# Patient Record
Sex: Male | Born: 1935
Health system: Southern US, Community
[De-identification: ages and names within clinical notes are randomized; demographics above are authoritative.]

## PROBLEM LIST (undated history)

## (undated) DIAGNOSIS — K222 Esophageal obstruction: Secondary | ICD-10-CM

## (undated) DIAGNOSIS — K5790 Diverticulosis of intestine, part unspecified, without perforation or abscess without bleeding: Secondary | ICD-10-CM

## (undated) DIAGNOSIS — I252 Old myocardial infarction: Secondary | ICD-10-CM

## (undated) DIAGNOSIS — I10 Essential (primary) hypertension: Secondary | ICD-10-CM

## (undated) DIAGNOSIS — K219 Gastro-esophageal reflux disease without esophagitis: Secondary | ICD-10-CM

## (undated) DIAGNOSIS — R1310 Dysphagia, unspecified: Secondary | ICD-10-CM

## (undated) DIAGNOSIS — D509 Iron deficiency anemia, unspecified: Secondary | ICD-10-CM

## (undated) DIAGNOSIS — Z862 Personal history of diseases of the blood and blood-forming organs and certain disorders involving the immune mechanism: Secondary | ICD-10-CM

## (undated) DIAGNOSIS — Z87891 Personal history of nicotine dependence: Secondary | ICD-10-CM

## (undated) DIAGNOSIS — Z860100 Personal history of colon polyps, unspecified: Secondary | ICD-10-CM

## (undated) DIAGNOSIS — I00 Rheumatic fever without heart involvement: Secondary | ICD-10-CM

## (undated) HISTORY — DX: Gastro-esophageal reflux disease without esophagitis: K21.9

## (undated) HISTORY — PX: SKIN CANCER EXCISION: SHX779

## (undated) HISTORY — DX: Esophageal obstruction: K22.2

## (undated) HISTORY — DX: Essential (primary) hypertension: I10

## (undated) HISTORY — DX: Dysphagia, unspecified: R13.10

## (undated) HISTORY — DX: Rheumatic fever without heart involvement: I00

## (undated) HISTORY — DX: Diverticulosis of intestine, part unspecified, without perforation or abscess without bleeding: K57.90

## (undated) HISTORY — PX: ADENOIDECTOMY: SUR15

## (undated) HISTORY — DX: Personal history of diseases of the blood and blood-forming organs and certain disorders involving the immune mechanism: Z86.2

## (undated) HISTORY — DX: Personal history of nicotine dependence: Z87.891

## (undated) HISTORY — PX: STENT PLACEMENT VASCULAR (ARMC HX): HXRAD1737

## (undated) HISTORY — DX: Iron deficiency anemia, unspecified: D50.9

## (undated) HISTORY — PX: BREAST SURGERY: SHX581

## (undated) HISTORY — DX: Personal history of colon polyps, unspecified: Z86.0100

## (undated) HISTORY — PX: TONSILLECTOMY: SUR1361

## (undated) HISTORY — DX: Old myocardial infarction: I25.2

## (undated) HISTORY — PX: HERNIA REPAIR: SHX51

---

## 2014-08-28 DIAGNOSIS — Z79899 Other long term (current) drug therapy: Secondary | ICD-10-CM | POA: Diagnosis not present

## 2014-08-28 DIAGNOSIS — D509 Iron deficiency anemia, unspecified: Secondary | ICD-10-CM | POA: Diagnosis not present

## 2014-08-28 DIAGNOSIS — Z23 Encounter for immunization: Secondary | ICD-10-CM | POA: Diagnosis not present

## 2014-08-28 DIAGNOSIS — Z0000 Encounter for general adult medical examination without abnormal findings: Secondary | ICD-10-CM | POA: Diagnosis not present

## 2014-08-28 DIAGNOSIS — Z125 Encounter for screening for malignant neoplasm of prostate: Secondary | ICD-10-CM | POA: Diagnosis not present

## 2014-08-28 DIAGNOSIS — E782 Mixed hyperlipidemia: Secondary | ICD-10-CM | POA: Diagnosis not present

## 2014-08-29 DIAGNOSIS — D509 Iron deficiency anemia, unspecified: Secondary | ICD-10-CM | POA: Diagnosis not present

## 2014-09-02 DIAGNOSIS — Z1211 Encounter for screening for malignant neoplasm of colon: Secondary | ICD-10-CM | POA: Diagnosis not present

## 2014-09-02 DIAGNOSIS — D649 Anemia, unspecified: Secondary | ICD-10-CM | POA: Diagnosis not present

## 2014-09-02 DIAGNOSIS — R195 Other fecal abnormalities: Secondary | ICD-10-CM | POA: Diagnosis not present

## 2014-09-05 DIAGNOSIS — D125 Benign neoplasm of sigmoid colon: Secondary | ICD-10-CM | POA: Diagnosis not present

## 2014-09-05 DIAGNOSIS — B3781 Candidal esophagitis: Secondary | ICD-10-CM | POA: Diagnosis not present

## 2014-09-05 DIAGNOSIS — K5730 Diverticulosis of large intestine without perforation or abscess without bleeding: Secondary | ICD-10-CM | POA: Diagnosis not present

## 2014-09-05 DIAGNOSIS — D500 Iron deficiency anemia secondary to blood loss (chronic): Secondary | ICD-10-CM | POA: Diagnosis not present

## 2014-09-05 DIAGNOSIS — R195 Other fecal abnormalities: Secondary | ICD-10-CM | POA: Diagnosis not present

## 2014-09-05 DIAGNOSIS — D123 Benign neoplasm of transverse colon: Secondary | ICD-10-CM | POA: Diagnosis not present

## 2014-09-17 DIAGNOSIS — D509 Iron deficiency anemia, unspecified: Secondary | ICD-10-CM | POA: Diagnosis not present

## 2014-09-17 DIAGNOSIS — R195 Other fecal abnormalities: Secondary | ICD-10-CM | POA: Diagnosis not present

## 2014-10-03 DIAGNOSIS — D509 Iron deficiency anemia, unspecified: Secondary | ICD-10-CM | POA: Diagnosis not present

## 2014-10-03 DIAGNOSIS — R195 Other fecal abnormalities: Secondary | ICD-10-CM | POA: Diagnosis not present

## 2014-10-07 DIAGNOSIS — R195 Other fecal abnormalities: Secondary | ICD-10-CM | POA: Diagnosis not present

## 2014-10-07 DIAGNOSIS — D509 Iron deficiency anemia, unspecified: Secondary | ICD-10-CM | POA: Diagnosis not present

## 2014-10-07 DIAGNOSIS — B3781 Candidal esophagitis: Secondary | ICD-10-CM | POA: Diagnosis not present

## 2014-12-05 DIAGNOSIS — D649 Anemia, unspecified: Secondary | ICD-10-CM | POA: Diagnosis not present

## 2015-01-22 DIAGNOSIS — D509 Iron deficiency anemia, unspecified: Secondary | ICD-10-CM | POA: Diagnosis not present

## 2015-01-22 DIAGNOSIS — Z23 Encounter for immunization: Secondary | ICD-10-CM | POA: Diagnosis not present

## 2015-01-23 DIAGNOSIS — Z1212 Encounter for screening for malignant neoplasm of rectum: Secondary | ICD-10-CM | POA: Diagnosis not present

## 2015-02-10 DIAGNOSIS — R1310 Dysphagia, unspecified: Secondary | ICD-10-CM | POA: Diagnosis not present

## 2015-02-10 DIAGNOSIS — R195 Other fecal abnormalities: Secondary | ICD-10-CM | POA: Diagnosis not present

## 2015-02-11 DIAGNOSIS — B3781 Candidal esophagitis: Secondary | ICD-10-CM | POA: Diagnosis not present

## 2015-02-11 DIAGNOSIS — R1310 Dysphagia, unspecified: Secondary | ICD-10-CM | POA: Diagnosis not present

## 2015-02-11 DIAGNOSIS — K219 Gastro-esophageal reflux disease without esophagitis: Secondary | ICD-10-CM | POA: Diagnosis not present

## 2015-02-16 DIAGNOSIS — Z79899 Other long term (current) drug therapy: Secondary | ICD-10-CM | POA: Diagnosis not present

## 2015-02-16 DIAGNOSIS — Z1389 Encounter for screening for other disorder: Secondary | ICD-10-CM | POA: Diagnosis not present

## 2015-02-16 DIAGNOSIS — R1310 Dysphagia, unspecified: Secondary | ICD-10-CM | POA: Diagnosis not present

## 2015-02-16 DIAGNOSIS — B3781 Candidal esophagitis: Secondary | ICD-10-CM | POA: Diagnosis not present

## 2015-02-16 DIAGNOSIS — E782 Mixed hyperlipidemia: Secondary | ICD-10-CM | POA: Diagnosis not present

## 2015-02-16 DIAGNOSIS — Z9181 History of falling: Secondary | ICD-10-CM | POA: Diagnosis not present

## 2015-02-19 DIAGNOSIS — B3781 Candidal esophagitis: Secondary | ICD-10-CM | POA: Diagnosis not present

## 2015-07-20 DIAGNOSIS — L820 Inflamed seborrheic keratosis: Secondary | ICD-10-CM | POA: Diagnosis not present

## 2015-07-20 DIAGNOSIS — D509 Iron deficiency anemia, unspecified: Secondary | ICD-10-CM | POA: Diagnosis not present

## 2015-07-20 DIAGNOSIS — B356 Tinea cruris: Secondary | ICD-10-CM | POA: Diagnosis not present

## 2015-09-23 DIAGNOSIS — J329 Chronic sinusitis, unspecified: Secondary | ICD-10-CM | POA: Diagnosis not present

## 2015-09-23 DIAGNOSIS — J40 Bronchitis, not specified as acute or chronic: Secondary | ICD-10-CM | POA: Diagnosis not present

## 2015-09-23 DIAGNOSIS — Z0000 Encounter for general adult medical examination without abnormal findings: Secondary | ICD-10-CM | POA: Diagnosis not present

## 2015-11-17 DIAGNOSIS — L089 Local infection of the skin and subcutaneous tissue, unspecified: Secondary | ICD-10-CM | POA: Diagnosis not present

## 2015-11-17 DIAGNOSIS — L723 Sebaceous cyst: Secondary | ICD-10-CM | POA: Diagnosis not present

## 2015-11-18 DIAGNOSIS — I10 Essential (primary) hypertension: Secondary | ICD-10-CM | POA: Diagnosis not present

## 2015-11-18 DIAGNOSIS — S51851A Open bite of right forearm, initial encounter: Secondary | ICD-10-CM | POA: Diagnosis not present

## 2016-02-29 DIAGNOSIS — Z23 Encounter for immunization: Secondary | ICD-10-CM | POA: Diagnosis not present

## 2016-04-28 DIAGNOSIS — M7581 Other shoulder lesions, right shoulder: Secondary | ICD-10-CM | POA: Diagnosis not present

## 2016-05-30 DIAGNOSIS — L301 Dyshidrosis [pompholyx]: Secondary | ICD-10-CM | POA: Diagnosis not present

## 2016-05-30 DIAGNOSIS — E782 Mixed hyperlipidemia: Secondary | ICD-10-CM | POA: Diagnosis not present

## 2016-05-30 DIAGNOSIS — Z79899 Other long term (current) drug therapy: Secondary | ICD-10-CM | POA: Diagnosis not present

## 2016-06-02 DIAGNOSIS — Z1389 Encounter for screening for other disorder: Secondary | ICD-10-CM | POA: Diagnosis not present

## 2016-06-02 DIAGNOSIS — I10 Essential (primary) hypertension: Secondary | ICD-10-CM | POA: Diagnosis not present

## 2016-06-02 DIAGNOSIS — E782 Mixed hyperlipidemia: Secondary | ICD-10-CM | POA: Diagnosis not present

## 2016-06-02 DIAGNOSIS — R1313 Dysphagia, pharyngeal phase: Secondary | ICD-10-CM | POA: Diagnosis not present

## 2016-06-02 DIAGNOSIS — K219 Gastro-esophageal reflux disease without esophagitis: Secondary | ICD-10-CM | POA: Diagnosis not present

## 2016-09-19 DIAGNOSIS — D649 Anemia, unspecified: Secondary | ICD-10-CM | POA: Diagnosis not present

## 2016-10-27 DIAGNOSIS — N6310 Unspecified lump in the right breast, unspecified quadrant: Secondary | ICD-10-CM | POA: Diagnosis not present

## 2016-10-27 DIAGNOSIS — D509 Iron deficiency anemia, unspecified: Secondary | ICD-10-CM | POA: Diagnosis not present

## 2016-11-02 DIAGNOSIS — R928 Other abnormal and inconclusive findings on diagnostic imaging of breast: Secondary | ICD-10-CM | POA: Diagnosis not present

## 2016-11-02 DIAGNOSIS — N6489 Other specified disorders of breast: Secondary | ICD-10-CM | POA: Diagnosis not present

## 2016-11-02 DIAGNOSIS — N644 Mastodynia: Secondary | ICD-10-CM | POA: Diagnosis not present

## 2016-11-02 DIAGNOSIS — N62 Hypertrophy of breast: Secondary | ICD-10-CM | POA: Diagnosis not present

## 2016-11-03 DIAGNOSIS — M7581 Other shoulder lesions, right shoulder: Secondary | ICD-10-CM | POA: Diagnosis not present

## 2017-01-03 DIAGNOSIS — D509 Iron deficiency anemia, unspecified: Secondary | ICD-10-CM | POA: Diagnosis not present

## 2017-01-16 DIAGNOSIS — R05 Cough: Secondary | ICD-10-CM | POA: Diagnosis not present

## 2017-01-16 DIAGNOSIS — D492 Neoplasm of unspecified behavior of bone, soft tissue, and skin: Secondary | ICD-10-CM | POA: Diagnosis not present

## 2017-01-30 DIAGNOSIS — Z23 Encounter for immunization: Secondary | ICD-10-CM | POA: Diagnosis not present

## 2017-01-31 DIAGNOSIS — C44219 Basal cell carcinoma of skin of left ear and external auricular canal: Secondary | ICD-10-CM | POA: Diagnosis not present

## 2017-01-31 DIAGNOSIS — L821 Other seborrheic keratosis: Secondary | ICD-10-CM | POA: Diagnosis not present

## 2017-02-15 DIAGNOSIS — C44219 Basal cell carcinoma of skin of left ear and external auricular canal: Secondary | ICD-10-CM | POA: Diagnosis not present

## 2017-05-04 DIAGNOSIS — Z79899 Other long term (current) drug therapy: Secondary | ICD-10-CM | POA: Diagnosis not present

## 2017-05-04 DIAGNOSIS — D509 Iron deficiency anemia, unspecified: Secondary | ICD-10-CM | POA: Diagnosis not present

## 2017-05-04 DIAGNOSIS — D649 Anemia, unspecified: Secondary | ICD-10-CM | POA: Diagnosis not present

## 2017-05-04 DIAGNOSIS — Z0000 Encounter for general adult medical examination without abnormal findings: Secondary | ICD-10-CM | POA: Diagnosis not present

## 2017-05-04 DIAGNOSIS — M76892 Other specified enthesopathies of left lower limb, excluding foot: Secondary | ICD-10-CM | POA: Diagnosis not present

## 2017-05-04 DIAGNOSIS — K219 Gastro-esophageal reflux disease without esophagitis: Secondary | ICD-10-CM | POA: Diagnosis not present

## 2017-05-04 DIAGNOSIS — I10 Essential (primary) hypertension: Secondary | ICD-10-CM | POA: Diagnosis not present

## 2017-08-02 DIAGNOSIS — D509 Iron deficiency anemia, unspecified: Secondary | ICD-10-CM | POA: Diagnosis not present

## 2017-08-21 DIAGNOSIS — K2970 Gastritis, unspecified, without bleeding: Secondary | ICD-10-CM | POA: Diagnosis not present

## 2017-08-29 DIAGNOSIS — K449 Diaphragmatic hernia without obstruction or gangrene: Secondary | ICD-10-CM | POA: Diagnosis not present

## 2017-08-29 DIAGNOSIS — K648 Other hemorrhoids: Secondary | ICD-10-CM | POA: Diagnosis not present

## 2017-08-29 DIAGNOSIS — D649 Anemia, unspecified: Secondary | ICD-10-CM | POA: Diagnosis not present

## 2017-08-29 DIAGNOSIS — B3781 Candidal esophagitis: Secondary | ICD-10-CM | POA: Diagnosis not present

## 2017-08-29 DIAGNOSIS — D126 Benign neoplasm of colon, unspecified: Secondary | ICD-10-CM | POA: Diagnosis not present

## 2017-08-29 DIAGNOSIS — K222 Esophageal obstruction: Secondary | ICD-10-CM | POA: Diagnosis not present

## 2017-08-29 DIAGNOSIS — K219 Gastro-esophageal reflux disease without esophagitis: Secondary | ICD-10-CM | POA: Diagnosis not present

## 2017-08-29 DIAGNOSIS — Z79899 Other long term (current) drug therapy: Secondary | ICD-10-CM | POA: Diagnosis not present

## 2017-08-29 DIAGNOSIS — I10 Essential (primary) hypertension: Secondary | ICD-10-CM | POA: Diagnosis not present

## 2017-08-29 DIAGNOSIS — R1310 Dysphagia, unspecified: Secondary | ICD-10-CM | POA: Diagnosis not present

## 2017-08-29 DIAGNOSIS — D122 Benign neoplasm of ascending colon: Secondary | ICD-10-CM | POA: Diagnosis not present

## 2017-08-29 DIAGNOSIS — K5730 Diverticulosis of large intestine without perforation or abscess without bleeding: Secondary | ICD-10-CM | POA: Diagnosis not present

## 2017-08-29 DIAGNOSIS — D509 Iron deficiency anemia, unspecified: Secondary | ICD-10-CM | POA: Diagnosis not present

## 2017-10-25 DIAGNOSIS — D649 Anemia, unspecified: Secondary | ICD-10-CM | POA: Diagnosis not present

## 2017-11-01 DIAGNOSIS — K2970 Gastritis, unspecified, without bleeding: Secondary | ICD-10-CM | POA: Diagnosis not present

## 2018-01-04 DIAGNOSIS — R05 Cough: Secondary | ICD-10-CM | POA: Diagnosis not present

## 2018-01-04 DIAGNOSIS — Z9181 History of falling: Secondary | ICD-10-CM | POA: Diagnosis not present

## 2018-01-30 DIAGNOSIS — R05 Cough: Secondary | ICD-10-CM | POA: Diagnosis not present

## 2018-01-30 DIAGNOSIS — Z23 Encounter for immunization: Secondary | ICD-10-CM | POA: Diagnosis not present

## 2018-02-21 ENCOUNTER — Encounter: Payer: Self-pay | Admitting: Allergy and Immunology

## 2018-02-21 ENCOUNTER — Telehealth: Payer: Self-pay

## 2018-02-21 ENCOUNTER — Ambulatory Visit: Payer: Medicare Other | Admitting: Allergy and Immunology

## 2018-02-21 VITALS — BP 136/78 | HR 60 | Temp 97.7°F | Resp 18 | Ht 70.0 in | Wt 236.0 lb

## 2018-02-21 DIAGNOSIS — J3089 Other allergic rhinitis: Secondary | ICD-10-CM | POA: Diagnosis not present

## 2018-02-21 DIAGNOSIS — R053 Chronic cough: Secondary | ICD-10-CM

## 2018-02-21 DIAGNOSIS — R05 Cough: Secondary | ICD-10-CM | POA: Diagnosis not present

## 2018-02-21 DIAGNOSIS — K219 Gastro-esophageal reflux disease without esophagitis: Secondary | ICD-10-CM | POA: Diagnosis not present

## 2018-02-21 MED ORDER — MONTELUKAST SODIUM 10 MG PO TABS: 10.0000 mg | ORAL_TABLET | Freq: Every day | ORAL | 1 refills | Status: DC

## 2018-02-21 MED ORDER — FAMOTIDINE 40 MG PO TABS: 40.0000 mg | ORAL_TABLET | Freq: Every day | ORAL | 1 refills | Status: DC

## 2018-02-21 MED ORDER — OMEPRAZOLE 40 MG PO CPDR: DELAYED_RELEASE_CAPSULE | ORAL | 1 refills | Status: DC

## 2018-02-21 NOTE — Patient Instructions (Addendum)
  1.  Obtain a chest x-ray at [facility] and rhinoscopy to look at throat with ENT  2.  Eliminate use of fish oil  3.  Treat reflux:   A.  Increase omeprazole 40 mg twice a day  B.  Start famotidine 40 mg in evening  C.  Slowly consolidate all forms of caffeine  4.  Treat inflammation:   A.  OTC Nasacort -1 spray each nostril 1 time per day  B.  Montelukast 10 mg -1 tablet 1 time per day  5.  If needed:   A.  OTC Mucinex DM 2 tablets twice a day  B.  Xyzal 5 mg 1 tablet once a day  6.  Review results from 06/07/2017 upper endoscopies  7.  Return to clinic in 4 weeks or earlier if problem

## 2018-02-21 NOTE — Progress Notes (Signed)
Dear Dr. Humphrey Rolls,  Thank you for referring Ian Diaz to the [facility] on 02/21/2018.   Below is a summation of this patient's evaluation and recommendations.  Thank you for your referral. I will keep you informed about this patient's response to treatment.   If you have any questions please do not hesitate to contact me.   Sincerely,  Jiles Prows, MD Allergy / Immunology [facility]   ______________________________________________________________________    NEW PATIENT NOTE  Referring Provider: Mateo Flow, MD Primary Provider: Mateo Flow, MD Date of office visit: 02/21/2018    Subjective:   Chief Complaint:  Ian Diaz (DOB: Feb 05, 1936) is a 82 y.o. male who presents to the clinic on 02/21/2018 with a chief complaint of Cough .     HPI: Hoyt presents to this clinic in evaluation of cough.  He has had a cough for greater than a decade but over the course of the past 2 years this cough has become quite progressive.  He coughs and brings up phlegm.  He is not sure if the phlegm is coming from his chest or his throat but he definitely has a sensation of a coating in his throat that is difficult to clear and he has lots of throat clearing.  He coughs so hard that he develops inguinal and abdominal pain from straining but he does not have a tremendous amount of gagging or retching or posttussive emesis.  Cough does appear to occur at nighttime as well as daytime.  He wakes up in the morning and has a little bit of nasal congestion and nose blowing.  He does not have any anosmia or ugly nasal discharge or history of recurrent headaches.  He has also had painful swallowing with both hot liquids and cool liquids over the course of the past few months but this has dramatically decreased over the course of the past several weeks.  He has never had a history of asthma and he does  not develop shortness of breath or chest tightness and has never heard himself wheeze.  He did smoke tobacco at 2 to 3 packs a day for about 40 years which he discontinued in 1990.  He has very bad reflux with regurgitation and burning all the way up into his throat for which he would use a pack of Tums every day.  For the past 2 years he has been using omeprazole which he thinks helps this issue dramatically.  He drinks at least 4 coffees per day and may be up to 8 coffees per day and he drinks alcohol 1 or 2 times per week.  He apparently had 2 upper endoscopies performed in 2019, 1 by Dr. Melina Copa and one by Dr. Lyda Jester, for what sounds like a very significant iron deficiency anemia.  Apparently there was a "fungus" found in his esophagus.  Therapy to date has included the use of Advair which he used for 1 month which did not help him at all.  He has used multiple "allergy pills" which has not helped him at all.  There has not really been an obvious provoking factor giving rise to this cough.  Past Medical History:  Diagnosis Date  . Acid reflux   . History of tobacco abuse   . Hypertension   . Iron deficiency anemia   . Rheumatic fever childhood    Past Surgical History:  Procedure Laterality  Date  . ADENOIDECTOMY    . BREAST SURGERY     Tumor  . HERNIA REPAIR    . SKIN CANCER EXCISION    . TONSILLECTOMY      Allergies as of 02/21/2018   No Known Allergies     Medication List      amLODipine 5 MG tablet Commonly known as:  NORVASC Take 5 mg by mouth daily.   atenolol 25 MG tablet Commonly known as:  TENORMIN Take 25 mg by mouth daily.   candesartan-hydrochlorothiazide 16-12.5 MG tablet Commonly known as:  ATACAND HCT Take 1 tablet by mouth daily.   CENTRUM SILVER PO Take by mouth daily.   FISH OIL PO Take by mouth daily.   IRON (FERROUS SULFATE) PO Take by mouth daily.   levocetirizine 5 MG tablet Commonly known as:  XYZAL Take 5 mg by mouth daily.     omeprazole 20 MG capsule Commonly known as:  PRILOSEC Take 20 mg by mouth daily.   VITAMIN C PO Take by mouth daily.       Review of systems negative except as noted in HPI / PMHx or noted below:  Review of Systems  Constitutional: Negative.   HENT: Negative.   Eyes: Negative.   Respiratory: Negative.   Cardiovascular: Negative.   Gastrointestinal: Negative.   Genitourinary: Negative.   Musculoskeletal: Negative.   Skin: Negative.   Neurological: Negative.   Endo/Heme/Allergies: Negative.   Psychiatric/Behavioral: Negative.     Family History  Problem Relation Age of Onset  . High blood pressure Mother   . Heart attack Mother   . Stroke Father   . High blood pressure Father   . COPD Brother     Social History   Socioeconomic History  . Marital status: Married    Spouse name: Not on file  . Number of children: Not on file  . Years of education: Not on file  . Highest education level: Not on file  Occupational History  . Not on file  Social Needs  . Financial resource strain: Not on file  . Food insecurity:    Worry: Not on file    Inability: Not on file  . Transportation needs:    Medical: Not on file    Non-medical: Not on file  Tobacco Use  . Smoking status: Former Smoker    Packs/day: 3.00    Years: 40.00    Pack years: 120.00    Types: Cigarettes    Last attempt to quit: 1990    Years since quitting: 29.8  . Smokeless tobacco: Never Used  Substance and Sexual Activity  . Alcohol use: Yes    Comment: Social   . Drug use: Never  . Sexual activity: Not on file  Lifestyle  . Physical activity:    Days per week: Not on file    Minutes per session: Not on file  . Stress: Not on file  Relationships  . Social connections:    Talks on phone: Not on file    Gets together: Not on file    Attends religious service: Not on file    Active member of club or organization: Not on file    Attends meetings of clubs or organizations: Not on file     Relationship status: Not on file  . Intimate partner violence:    Fear of current or ex partner: Not on file    Emotionally abused: Not on file    Physically abused: Not on file  Forced sexual activity: Not on file  Other Topics Concern  . Not on file  Social History Narrative  . Not on file    Environmental and Social history  Lives in a house with a dry environment, a cat located inside the household, carpet in the bedroom, no plastic on the bed, no plastic on the pillow, no smokers located inside the household.  Objective:   Vitals:   02/21/18 1358  BP: 136/78  Pulse: 60  Resp: 18  Temp: 97.7 F (36.5 C)   Height: 5\' 10"  (177.8 cm) Weight: 236 lb (107 kg)  Physical Exam  Constitutional:  Slight cough, throat clearing  HENT:  Head: Normocephalic. Head is without right periorbital erythema and without left periorbital erythema.  Right Ear: Tympanic membrane, external ear and ear canal normal.  Left Ear: Tympanic membrane, external ear and ear canal normal.  Nose: Nose normal. No mucosal edema or rhinorrhea.  Mouth/Throat: Oropharynx is clear and moist and mucous membranes are normal. No oropharyngeal exudate.  Eyes: Pupils are equal, round, and reactive to light. Conjunctivae and lids are normal.  Neck: Trachea normal. No tracheal deviation present. No thyromegaly present.  Cardiovascular: Normal rate, regular rhythm, S1 normal, S2 normal and normal heart sounds.  No murmur heard. Pulmonary/Chest: Effort normal. No stridor. No respiratory distress. He has no wheezes. He has no rales. He exhibits no tenderness.  Abdominal: Soft. He exhibits no distension and no mass. There is no hepatosplenomegaly. There is no tenderness. There is no rebound and no guarding.  Musculoskeletal: He exhibits no edema or tenderness.  Lymphadenopathy:       Head (right side): No tonsillar adenopathy present.       Head (left side): No tonsillar adenopathy present.    He has no cervical  adenopathy.    He has no axillary adenopathy.  Neurological: He is alert.  Skin: No rash noted. He is not diaphoretic. No erythema. No pallor. Nails show no clubbing.    Diagnostics: Allergy skin tests were not performed secondary to the recent use of Xyzal.   Spirometry was performed and demonstrated an FEV1 of 2.66 @ 92 % of predicted. FEV1/FVC = 0.74.  Following administration of nebulized albuterol his FEV1 did not improve.  Assessment and Plan:    1. LPRD (laryngopharyngeal reflux disease)   2. Perennial allergic rhinitis   3. Persistent cough for 3 weeks or longer     1.  Obtain a chest x-ray at [facility] and rhinoscopy to look at throat with ENT  2.  Eliminate use of fish oil  3.  Treat reflux:   A.  Increase omeprazole 40 mg twice a day  B.  Start famotidine 40 mg in evening  C.  Slowly consolidate all forms of caffeine  4.  Treat inflammation:   A.  OTC Nasacort -1 spray each nostril 1 time per day  B.  Montelukast 10 mg -1 tablet 1 time per day  5.  If needed:   A.  OTC Mucinex DM 2 tablets twice a day  B.  Xyzal 5 mg 1 tablet once a day  6.  Review results from 06/07/2017 upper endoscopies  7.  Return to clinic in 4 weeks or earlier if problem  Wilkie probably has a multifactorial form of cough with contribution from airway inflammation and reflux.  I suspect that the major insult is reflux and we will aggressively treat this issue with therapy noted above and also have him use anti-inflammatory agents for  his airway.  I am going to have him obtain a chest x-ray and we will look at his throat via rhinoscopy with ENT evaluation.  I will regroup with him in 4 weeks to make a decision about further evaluation and treatment based upon his initial response to this plan.  Jiles Prows, MD Allergy / Immunology [facility]

## 2018-02-21 NOTE — Telephone Encounter (Signed)
Called and informed patient that he is scheduled at [facility] on Thursday, December 5th at 8:30am.  I did ask him to arrive a bit early to check-in.

## 2018-02-22 ENCOUNTER — Encounter: Payer: Self-pay | Admitting: Allergy and Immunology

## 2018-02-22 NOTE — Addendum Note (Signed)
Addended by: Carin Hock on: 02/22/2018 02:05 PM   Modules accepted: Orders

## 2018-02-23 ENCOUNTER — Telehealth: Payer: Self-pay | Admitting: *Deleted

## 2018-02-23 NOTE — Telephone Encounter (Signed)
Patient informed of chest CT appt at [facility] - November 13th arriving at 7:30 for an 8:30 appt. Order faxed.

## 2018-02-26 ENCOUNTER — Encounter: Payer: Self-pay | Admitting: *Deleted

## 2018-02-28 DIAGNOSIS — I700 Atherosclerosis of aorta: Secondary | ICD-10-CM | POA: Diagnosis not present

## 2018-02-28 DIAGNOSIS — R918 Other nonspecific abnormal finding of lung field: Secondary | ICD-10-CM | POA: Diagnosis not present

## 2018-03-19 ENCOUNTER — Encounter: Payer: Self-pay | Admitting: Allergy and Immunology

## 2018-03-19 ENCOUNTER — Ambulatory Visit: Payer: Medicare Other | Admitting: Allergy and Immunology

## 2018-03-19 VITALS — BP 136/72 | HR 60 | Resp 16

## 2018-03-19 DIAGNOSIS — R05 Cough: Secondary | ICD-10-CM

## 2018-03-19 DIAGNOSIS — K219 Gastro-esophageal reflux disease without esophagitis: Secondary | ICD-10-CM | POA: Diagnosis not present

## 2018-03-19 DIAGNOSIS — J3089 Other allergic rhinitis: Secondary | ICD-10-CM

## 2018-03-19 DIAGNOSIS — R053 Chronic cough: Secondary | ICD-10-CM

## 2018-03-19 NOTE — Patient Instructions (Addendum)
  1.  Keep ENT appointment this week  2.  Continue to treat reflux:   A.  omeprazole 40 mg twice a day  B.  famotidine 40 mg in evening  4.  Continue to treat inflammation:   A.  OTC Nasacort -1 spray each nostril 1 time per day  B.  Montelukast 10 mg -1 tablet 1 time per day  5.  If needed:   A.  OTC Mucinex DM 2 tablets twice a day  B.  Xyzal 5 mg 1 tablet once a day  6.  Return to clinic in 8 weeks or earlier if problem

## 2018-03-19 NOTE — Progress Notes (Signed)
Follow-up Note  Referring Provider: Mateo Flow, MD Primary Provider: Mateo Flow, MD Date of Office Visit: 03/19/2018  Subjective:   Ian Diaz (DOB: 1935-06-06) is a 82 y.o. male who returns to the [facility] on 03/19/2018 in re-evaluation of the following:  HPI: Ian Diaz presents to this clinic in evaluation of his cough believed secondary to a collection of medical issues including LPR and allergic rhinitis.  His last visit was his initial evaluation of 21 February 2018.  He is 90% better regarding his cough since his last visit.  He has had a decrease in his drainage and his throat clearing.  However, he still has a sore throat that appears to occur a few times per night and in the morning for which he will drink some fluid.  His reflux is better.  His nose is doing relatively well other than the fact that he has some "drainage".  He has eliminated all caffeine consumption.  He is scheduled to see ENT this week.  Allergies as of 03/19/2018   No Known Allergies     Medication List      amLODipine 5 MG tablet Commonly known as:  NORVASC Take 5 mg by mouth daily.   atenolol 25 MG tablet Commonly known as:  TENORMIN Take 25 mg by mouth daily.   candesartan-hydrochlorothiazide 16-12.5 MG tablet Commonly known as:  ATACAND HCT Take 1 tablet by mouth daily.   CENTRUM SILVER PO Take by mouth daily.   famotidine 40 MG tablet Commonly known as:  PEPCID Take 1 tablet (40 mg total) by mouth at bedtime.   levocetirizine 5 MG tablet Commonly known as:  XYZAL Take 5 mg by mouth every evening.   montelukast 10 MG tablet Commonly known as:  SINGULAIR Take 1 tablet (10 mg total) by mouth at bedtime.   omeprazole 40 MG capsule Commonly known as:  PRILOSEC Take one capsule by mouth twice daily as directed.       Past Medical History:  Diagnosis Date  . Acid reflux   . History of tobacco abuse   . Hypertension   . Iron deficiency anemia   .  Rheumatic fever childhood    Past Surgical History:  Procedure Laterality Date  . ADENOIDECTOMY    . BREAST SURGERY     Tumor  . HERNIA REPAIR    . SKIN CANCER EXCISION    . TONSILLECTOMY      Review of systems negative except as noted in HPI / PMHx or noted below:  Review of Systems  Constitutional: Negative.   HENT: Negative.   Eyes: Negative.   Respiratory: Negative.   Cardiovascular: Negative.   Gastrointestinal: Negative.   Genitourinary: Negative.   Musculoskeletal: Negative.   Skin: Negative.   Neurological: Negative.   Endo/Heme/Allergies: Negative.   Psychiatric/Behavioral: Negative.      Objective:   Vitals:   03/19/18 0903  BP: 136/72  Pulse: 60  Resp: 16          Physical Exam  HENT:  Head: Normocephalic.  Right Ear: Tympanic membrane, external ear and ear canal normal.  Left Ear: Tympanic membrane, external ear and ear canal normal.  Nose: Nose normal. No mucosal edema or rhinorrhea.  Mouth/Throat: Uvula is midline, oropharynx is clear and moist and mucous membranes are normal. No oropharyngeal exudate.  Eyes: Conjunctivae are normal.  Neck: Trachea normal. No tracheal tenderness present. No tracheal deviation present. No thyromegaly present.  Cardiovascular: Normal rate, regular  rhythm, S1 normal, S2 normal and normal heart sounds.  No murmur heard. Pulmonary/Chest: Breath sounds normal. No stridor. No respiratory distress. He has no wheezes. He has no rales.  Musculoskeletal: He exhibits no edema.  Lymphadenopathy:       Head (right side): No tonsillar adenopathy present.       Head (left side): No tonsillar adenopathy present.    He has no cervical adenopathy.  Neurological: He is alert.  Skin: No rash noted. He is not diaphoretic. No erythema. Nails show no clubbing.    Diagnostics:     Results of a chest x-ray obtained 21 February 2018 identified right ninth rib lesion showing heterogeneous density and expansion of the lateral right  ninth rib.  Results of a chest CT scan obtained 28 February 2018 identified a elongated expansile lytic lesion within the lateral right ninth rib.  Old studies from 2009 were obtained and reviewed.  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  Assessment and Plan:   1. Perennial allergic rhinitis   2. LPRD (laryngopharyngeal reflux disease)   3. Persistent cough for 3 weeks or longer     1.  Keep ENT appointment this week  2.  Continue to treat reflux:   A.  omeprazole 40 mg twice a day  B.  famotidine 40 mg in evening  4.  Continue to treat inflammation:   A.  OTC Nasacort -1 spray each nostril 1 time per day  B.  Montelukast 10 mg -1 tablet 1 time per day  5.  If needed:   A.  OTC Mucinex DM 2 tablets twice a day  B.  Xyzal 5 mg 1 tablet once a day  6.  Return to clinic in 8 weeks or earlier if problem  Tammie appears to be doing relatively well and he is definitely shown improvement as he has addressed the issue with LPR and inflammation of his upper airway.  I would like for him to continue on this plan for a full 12 weeks and thus I will see him back in this clinic in 8 weeks.  He will follow-up with ENT regarding a thorough evaluation of his laryngeal structures especially given the fact that he still has some painful swallowing on occasion.  Allena Katz, MD Allergy / Immunology [place]

## 2018-03-20 ENCOUNTER — Encounter: Payer: Self-pay | Admitting: Allergy and Immunology

## 2018-03-22 DIAGNOSIS — J342 Deviated nasal septum: Secondary | ICD-10-CM | POA: Diagnosis not present

## 2018-03-22 DIAGNOSIS — Z87891 Personal history of nicotine dependence: Secondary | ICD-10-CM | POA: Diagnosis not present

## 2018-03-22 DIAGNOSIS — K219 Gastro-esophageal reflux disease without esophagitis: Secondary | ICD-10-CM | POA: Diagnosis not present

## 2018-03-22 DIAGNOSIS — R05 Cough: Secondary | ICD-10-CM | POA: Diagnosis not present

## 2018-03-22 DIAGNOSIS — R0989 Other specified symptoms and signs involving the circulatory and respiratory systems: Secondary | ICD-10-CM | POA: Diagnosis not present

## 2018-03-26 DIAGNOSIS — R05 Cough: Secondary | ICD-10-CM | POA: Diagnosis not present

## 2018-03-27 ENCOUNTER — Encounter: Payer: Self-pay | Admitting: *Deleted

## 2018-04-04 ENCOUNTER — Encounter: Payer: Self-pay | Admitting: *Deleted

## 2018-04-19 ENCOUNTER — Encounter: Payer: Self-pay | Admitting: Internal Medicine

## 2018-04-19 ENCOUNTER — Ambulatory Visit (INDEPENDENT_AMBULATORY_CARE_PROVIDER_SITE_OTHER): Payer: Medicare Other | Admitting: Internal Medicine

## 2018-04-19 VITALS — BP 130/70 | HR 63 | Ht 72.0 in | Wt 236.4 lb

## 2018-04-19 DIAGNOSIS — R058 Other specified cough: Secondary | ICD-10-CM | POA: Insufficient documentation

## 2018-04-19 DIAGNOSIS — R05 Cough: Secondary | ICD-10-CM | POA: Diagnosis not present

## 2018-04-19 HISTORY — DX: Other specified cough: R05.8

## 2018-04-19 LAB — NITRIC OXIDE: Nitric Oxide: 30

## 2018-04-19 MED ORDER — PREDNISONE 10 MG PO TABS: ORAL_TABLET | ORAL | 0 refills | Status: DC

## 2018-04-19 NOTE — Assessment & Plan Note (Addendum)
Onset was summer 2018  Per pt though office notes say as far back as   2009 Prior GI eval 08/2017 egd c/w HH and was dilated empirically  - Spirometry 04/19/2018  FEV1 2.6 (83%)  Ratio 73 s curvature s prior rx  - FENO 04/19/2018  =   30  - Allergy profile 04/19/2018 >  Eos 0. /  IgE  Pending - Dg Es ordered    The most common causes of chronic cough in immunocompetent adults include the following: upper airway cough syndrome (UACS), previously referred to as postnasal drip syndrome (PNDS), which is caused by variety of rhinosinus conditions; (2) asthma; (3) GERD; (4) chronic bronchitis from cigarette smoking or other inhaled environmental irritants; (5) nonasthmatic eosinophilic bronchitis; and (6) bronchiectasis.   These conditions, singly or in combination, have accounted for up to 94% of the causes of chronic cough in prospective studies.   Other conditions have constituted no >6% of the causes in prospective studies These have included bronchogenic carcinoma, chronic interstitial pneumonia, sarcoidosis, left ventricular failure, ACEI-induced cough, and aspiration from a condition associated with pharyngeal dysfunction.    Chronic cough is often simultaneously caused by more than one condition. A single cause has been found from 38 to 82% of the time, multiple causes from 18 to 62%. Multiply caused cough has been the result of three diseases up to 42% of the time.       Most likely this is not asthma related as not waking him up and ent eval neg for sinusitis but pattern is most c/w Upper airway cough syndrome (previously labeled PNDS),  is so named because it's frequently impossible to sort out how much is  CR/sinusitis with freq throat clearing (which can be related to primary GERD)   vs  causing  secondary (" extra esophageal")  GERD from wide swings in gastric pressure that occur with throat clearing, often  promoting self use of mint and menthol lozenges that reduce the lower esophageal sphincter  tone and exacerbate the problem further in a cyclical fashion.   These are the same pts (now being labeled as having "irritable larynx syndrome" by some cough centers) who not infrequently have a history of having failed to tolerate ace inhibitors and sometimes ARBs though I haven't seen it with the one he's on now,  dry powder inhalers like anoro  or biphosphonates or report having atypical/extraesophageal reflux symptoms that don't respond to standard doses of PPI  and are easily confused as having aecopd or asthma flares by even experienced allergists/ pulmonologists (myself included).    For now rec max gerd rx, diet changes and try add 1st gen H1 blockers per guidelines pending  DgEs and allergy profile findings and return here q 2 weeks until we have a better handle on this "refractory cough"  (to avoid the issue of misunderstandings which clearly has been the case with prior providers as to cause / effects/benefits of empirical tirals).  Emphasized: The standardized cough guidelines published in Chest by Lissa Morales in 2006 are still the best available and consist of a multiple step process (up to 12!) , not a single office visit,  and are intended  to address this problem logically,  with an alogrithm dependent on response to empiric treatment at  each progressive step  to determine a specific diagnosis with  minimal addtional testing needed. Therefore if adherence is an issue or can't be accurately verified,  it's very unlikely the standard evaluation and treatment will  be successful here.    Furthermore, response to therapy (other than acute cough suppression, which should only be used short term with avoidance of narcotic containing cough syrups if possible), can be a gradual process for which the patient is not likely to  perceive immediate benefit.  Unlike going to an eye doctor where the best perscription is almost always the first one and is immediately effective, this is almost never the  case in the management of chronic cough syndromes. Therefore the patient needs to commit up front to consistently adhere to recommendations  for up to 6 weeks of therapy directed at the likely underlying problem(s) before the response can be reasonably evaluated and he agreed to this approach today.     >>> return in 2 weeks with all meds in hand using a trust but verify approach to confirm accurate Medication  Reconciliation The principal here is that until we are certain that the  patients are doing what we've asked, it makes no sense to ask them to do more.    Total time devoted to counseling  > 50 % of initial 60 min office visit:  review case with pt/wife discussion of options/alternatives/ personally creating written customized instructions  in presence of pt  then going over those specific  Instructions directly with the pt including how to use all of the meds but in particular covering each new medication in detail and the difference between the maintenance= "automatic" meds and the prns using an action plan format for the latter (If this problem/symptom => do that organization reading Left to right).  Please see AVS from this visit for a full list of these instructions which I personally wrote for this pt and  are unique to this visit.

## 2018-04-19 NOTE — Patient Instructions (Addendum)
Take your acid suppression but take prilosec 40 mg Take 30- 60 min before your first and last meals of the day   Prednisone 10 mg take  4 each am x 2 days,   2 each am x 2 days,  1 each am x 2 days and stop  For drainage / throat tickle   try take CHLORPHENIRAMINE  4 mg (chlortabs at [facility]) - take one every 4 hours as needed - available over the counter- may cause drowsiness so start with just a bedtime dose or two and see how you tolerate it before trying in daytime    Please remember to go to the lab department   for your tests - we will call you with the results when they are available.      We will call to schedule Dg Esophagram  And I will call you with results    Please schedule a follow up office visit in 2  weeks, sooner if needed

## 2018-04-19 NOTE — Progress Notes (Signed)
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 Previous eval by Kozlow reported cough x 2009 but pt says this was misunderstanding and Kozlow rec ent  eval which pt says was negative and not helpful all occurring in the fall of 2019   Prior GI eval 08/2017 egd c/w HH and was dilated empirically      History of Present Illness  04/19/2018  Pulmonary/ 1st office eval/Nazar Kuan  Chief Complaint  Patient presents with  . Pulmonary Consult    Referred by Dr. Bertram Millard.  Pt c/o cough x 18 months, worse over the past 3-4 months. His cough is prod, esp in the am with yellow sputum.    Dyspnea:  Not limited by breathing from desired activities   Cough:  Worse first thing am and prod of sev tbsp of yellow mucus / no resp to gerd rx /pred/abx or anoro per pt and notes from Dr Chancy Milroy "not ever a smidge, not even for a short period of time (Dr Bruna Potter notes says 90% improved from rx gerd, singulair stop fish oil and excess coffee) Sleep: hob  6 inches or recliner makes no difference  SABA use: none     Kouffman Reflux v Neurogenic Cough Differentiator Reflux Comments  Do you awaken from a sound sleep coughing violently?                            With trouble breathing? No     Do you have choking episodes when you cannot  Get enough air, gasping for air ?              Rarely    Do you usually cough when you lie down into  The bed, or when you just lie down to rest ?                           No    Do you usually cough after meals or eating?         Still at table every time 90%   Do you cough when (or after) you bend over?    Yes   GERD SCORE     Kouffman Reflux v Neurogenic Cough Differentiator Neurogenic   Do you more-or-less cough all day long? All day but worse before 10 am    Does change of temperature make you cough? no   Does laughing or chuckling cause you to cough? no   Do fumes (perfume, automobile fumes, burned  Toast, etc.,) cause you to cough ?      No    Does speaking, singing, or talking on the phone cause you to cough   ?               Phone    Neurogenic/Airway  score       No obvious other  day to day or daytime variability or assoc  mucus plugs or hemoptysis or cp or chest tightness, subjective wheeze or overt sinus or hb symptoms.   Sleeping  without nocturnal   exacerbation  of respiratory  c/o's or need for noct saba. Also denies any obvious fluctuation of symptoms with weather or environmental changes or other aggravating or alleviating factors except as outlined above   No unusual exposure hx or h/o childhood pna/ asthma or knowledge of premature birth.  Current Allergies, Complete Past Medical History, Past Surgical History, Family History, and Social History were reviewed in [facility] record.  ROS  The following are not active complaints unless bolded Hoarseness, sore throat, dysphagia, dental problems, itching, sneezing,  nasal congestion or discharge of excess mucus or purulent secretions, ear ache,   fever, chills, sweats, unintended wt loss or wt gain, classically pleuritic or exertional cp,  orthopnea pnd or arm/hand swelling  or leg swelling, presyncope, palpitations, abdominal pain, anorexia, nausea, vomiting, diarrhea  or change in bowel habits or change in bladder habits, change in stools or change in urine, dysuria, hematuria,  rash, arthralgias, visual complaints, headache, numbness, weakness or  ataxia or problems with walking or coordination,  change in mood or  memory.           Past Medical History:  Diagnosis Date  . Acid reflux   . History of tobacco abuse   . Hypertension   . Iron deficiency anemia   . Rheumatic fever childhood      Outpatient Medications Prior to Visit  Medication Sig Dispense Refill  . amLODipine (NORVASC) 5 MG tablet Take 5 mg by mouth daily.    Marland Kitchen atenolol (TENORMIN) 25 MG tablet Take 25 mg by mouth daily.    . candesartan-hydrochlorothiazide (ATACAND HCT) 16-12.5 MG tablet Take 1 tablet by mouth daily.    . famotidine (PEPCID) 40 MG tablet Take 1 tablet (40 mg total) by mouth at bedtime. 30 tablet 1  . Ferrous Sulfate (IRON) 325 (65 Fe) MG TABS Take 1 tablet by mouth daily.    . montelukast (SINGULAIR) 10 MG tablet Take 1 tablet (10 mg total) by mouth at bedtime. 30 tablet 1  . Multiple Vitamins-Minerals (CENTRUM SILVER PO) Take by mouth daily.    Marland Kitchen omeprazole (PRILOSEC) 40 MG capsule Take one capsule by mouth twice daily as directed. 180 capsule 1  . levocetirizine (XYZAL) 5 MG tablet Take 5 mg by mouth every evening.        Objective:     BP 130/70 (BP Location: Left Arm, Cuff Size: Normal)   Pulse 63   Ht 6' (1.829 m)   Wt 236 lb 6.4 oz (107.2 kg)   SpO2 96%   BMI 32.06 kg/m   SpO2: 96 %  RA   Ambulatory pleasant white haired male nad/ min rattling cough on voluntary maneuver   HEENT: Full dentures/ mild/mod non-specific swelling of  turbinates bilaterally, and oropharynx. Nl external ear canals without cough reflex   NECK :  without JVD/Nodes/TM/ nl carotid upstrokes bilaterally   LUNGS: no acc muscle use,  Nl contour chest which is clear to A and P bilaterally with some cough on insp  Maneuvers  But not consistently and never on fvc    CV:  RRR  no s3 or murmur or increase in P2, and no edema   ABD:  Obese soft and nontender with nl inspiratory  excursion in the supine position. No bruits or organomegaly appreciated, bowel  sounds nl  MS:  Nl gait/ ext warm without deformities, calf tenderness, cyanosis or clubbing No obvious joint restrictions   SKIN: warm and dry without lesions    NEURO:  alert, approp, nl sensorium with  no motor or cerebellar deficits apparent.      I personally reviewed images and agree with radiology impression as follows:   Chest CT 02/28/18 1) enchondroma R lat 9th rib stable since 2009  2) 9 mm RLL well circ rec 6-12 m f/u    Labs ordered 04/19/2018  Allergy profile       Assessment   Upper airway cough syndrome Onset was summer 2018  Per pt though office notes say as far back as   2009 Prior GI eval 08/2017 egd c/w HH and was dilated empirically  - Spirometry 04/19/2018  FEV1 2.6 (83%)  Ratio 73 s curvature s prior rx  - FENO 04/19/2018  =   30  - Allergy profile 04/19/2018 >  Eos 0. /  IgE  Pending - Dg Es ordered    The most common causes of chronic cough in immunocompetent adults include the following: upper airway cough syndrome (UACS), previously referred to as postnasal drip syndrome (PNDS), which is caused by variety of rhinosinus conditions; (2) asthma; (3) GERD; (4) chronic bronchitis from cigarette smoking or other inhaled environmental irritants; (5) nonasthmatic eosinophilic bronchitis; and (6) bronchiectasis.   These conditions, singly or in combination, have accounted for up to 94% of the causes of chronic cough in prospective studies.   Other conditions have constituted no >6% of the causes in prospective studies These have included bronchogenic carcinoma, chronic interstitial pneumonia, sarcoidosis, left ventricular failure, ACEI-induced cough, and aspiration from a condition associated with pharyngeal dysfunction.    Chronic cough is often simultaneously caused by more than one condition. A single cause has been found from 38 to 82% of the time, multiple causes from 18 to 62%. Multiply caused cough has been the result of three diseases up to 42% of the time.        Most likely this is not asthma related as not waking him up and ent eval neg for sinusitis but pattern is most c/w Upper airway cough syndrome (previously labeled PNDS),  is so named because it's frequently impossible to sort out how much is  CR/sinusitis with freq throat clearing (which can be related to primary GERD)   vs  causing  secondary (" extra esophageal")  GERD from wide swings in gastric pressure that occur with throat clearing, often  promoting self use of mint and menthol lozenges that reduce the lower esophageal sphincter tone and exacerbate the problem further in a cyclical fashion.   These are the same pts (now being labeled as having "irritable larynx syndrome" by some cough centers) who not infrequently have a history of having failed to tolerate ace inhibitors and sometimes ARBs though I haven't seen it with the one he's on now,  dry powder inhalers like anoro  or biphosphonates or report having atypical/extraesophageal reflux symptoms that don't respond to standard doses of PPI  and are easily confused as having aecopd or asthma flares by even experienced allergists/ pulmonologists (myself included).    For now rec max gerd rx, diet changes and try add 1st gen H1 blockers per guidelines pending  DgEs and allergy profile findings and return here q 2 weeks until we have a better handle on  this "refractory cough"  (to avoid the issue of misunderstandings which clearly has been the case with prior providers as to cause / effects/benefits of empirical tirals).  Emphasized: The standardized cough guidelines published in Chest by Lissa Morales in 2006 are still the best available and consist of a multiple step process (up to 12!) , not a single office visit,  and are intended  to address this problem logically,  with an alogrithm dependent on response to empiric treatment at  each progressive step  to determine a specific diagnosis with  minimal addtional testing needed. Therefore if  adherence is an issue or can't be accurately verified,  it's very unlikely the standard evaluation and treatment will be successful here.    Furthermore, response to therapy (other than acute cough suppression, which should only be used short term with avoidance of narcotic containing cough syrups if possible), can be a gradual process for which the patient is not likely to  perceive immediate benefit.  Unlike going to an eye doctor where the best perscription is almost always the first one and is immediately effective, this is almost never the case in the management of chronic cough syndromes. Therefore the patient needs to commit up front to consistently adhere to recommendations  for up to 6 weeks of therapy directed at the likely underlying problem(s) before the response can be reasonably evaluated and he agreed to this approach today.     >>> return in 2 weeks with all meds in hand using a trust but verify approach to confirm accurate Medication  Reconciliation The principal here is that until we are certain that the  patients are doing what we've asked, it makes no sense to ask them to do more.       Total time devoted to counseling  > 50 % of initial 60 min office visit:  review case with pt/wife discussion of options/alternatives/ personally creating written customized instructions  in presence of pt  then going over those specific  Instructions directly with the pt including how to use all of the meds but in particular covering each new medication in detail and the difference between the maintenance= "automatic" meds and the prns using an action plan format for the latter (If this problem/symptom => do that organization reading Left to right).  Please see AVS from this visit for a full list of these instructions which I personally wrote for this pt and  are unique to this visit.       Christinia Gully, MD 04/19/2018

## 2018-04-20 ENCOUNTER — Encounter: Payer: Self-pay | Admitting: Internal Medicine

## 2018-04-20 LAB — RESPIRATORY ALLERGY PROFILE REGION II ~~LOC~~
Allergen, A. alternata, m6: <0.1 kU/L
Allergen, Comm Silver Birch, t9: <0.1 kU/L
Allergen, D pternoyssinus,d7: <0.1 kU/L
Allergen, Mouse Urine Protein, e78: <0.1 kU/L
Allergen, Oak,t7: <0.1 kU/L
Allergen, P. notatum, m1: <0.1 kU/L
Bermuda Grass: <0.1 kU/L
Box Elder IgE: <0.1 kU/L
CLASS: 0
CLASS: 0
CLASS: 0
CLASS: 0
Cat Dander: <0.1 kU/L
Class: 0
Class: 0
Class: 0
Class: 0
Class: 0
Class: 0
Class: 0
Class: 0
Class: 0
Class: 0
Class: 0
Class: 0
Class: 0
Class: 0
Class: 0
Class: 0
Class: 0
Class: 0
Class: 0
Class: 0
Cockroach: <0.1 kU/L
D. farinae: <0.1 kU/L
Dog Dander: <0.1 kU/L
Elm IgE: <0.1 kU/L
IgE (Immunoglobulin E), Serum: 206 kU/L — ABNORMAL HIGH (ref <=114)
Johnson Grass: <0.1 kU/L
Pecan/Hickory Tree IgE: <0.1 kU/L
Rough Pigweed  IgE: <0.1 kU/L
Sheep Sorrel IgE: <0.1 kU/L
Timothy Grass: <0.1 kU/L

## 2018-04-20 LAB — CBC WITH DIFFERENTIAL/PLATELET
BASOS ABS: 0.1 10*3/uL (ref 0.0–0.1)
Basophils Relative: 1 % (ref 0.0–3.0)
Eosinophils Absolute: 0.4 10*3/uL (ref 0.0–0.7)
Eosinophils Relative: 6.2 % — ABNORMAL HIGH (ref 0.0–5.0)
HCT: 45.9 % (ref 39.0–52.0)
Hemoglobin: 15.4 g/dL (ref 13.0–17.0)
Lymphocytes Relative: 32 % (ref 12.0–46.0)
Lymphs Abs: 1.8 10*3/uL (ref 0.7–4.0)
MCHC: 33.6 g/dL (ref 30.0–36.0)
MCV: 90.7 fl (ref 78.0–100.0)
Monocytes Absolute: 0.6 10*3/uL (ref 0.1–1.0)
Monocytes Relative: 9.9 % (ref 3.0–12.0)
Neutro Abs: 2.9 10*3/uL (ref 1.4–7.7)
Neutrophils Relative %: 50.9 % (ref 43.0–77.0)
Platelets: 191 10*3/uL (ref 150.0–400.0)
RBC: 5.06 Mil/uL (ref 4.22–5.81)
RDW: 13.5 % (ref 11.5–15.5)
WBC: 5.7 10*3/uL (ref 4.0–10.5)

## 2018-04-20 LAB — INTERPRETATION:

## 2018-04-23 ENCOUNTER — Telehealth: Payer: Self-pay | Admitting: Internal Medicine

## 2018-04-23 NOTE — Progress Notes (Signed)
LMTCB

## 2018-04-23 NOTE — Telephone Encounter (Signed)
Notes recorded by Tanda Rockers, MD on 04/20/2018 at 5:18 PM EST Call patient : Studies are neg for specific allergy   Called and spoke with pt letting him know the results of labwork. Pt expressed understanding. Nothing further needed.

## 2018-04-26 ENCOUNTER — Telehealth: Payer: Self-pay | Admitting: Internal Medicine

## 2018-04-26 ENCOUNTER — Ambulatory Visit (HOSPITAL_COMMUNITY)
Admission: RE | Admit: 2018-04-26 | Discharge: 2018-04-26 | Disposition: A | Discharge status: Home / Self Care | Payer: Medicare Other | Source: Ambulatory Visit | Attending: Internal Medicine | Admitting: Internal Medicine

## 2018-04-26 DIAGNOSIS — K449 Diaphragmatic hernia without obstruction or gangrene: Secondary | ICD-10-CM | POA: Diagnosis not present

## 2018-04-26 DIAGNOSIS — R05 Cough: Secondary | ICD-10-CM | POA: Insufficient documentation

## 2018-04-26 DIAGNOSIS — R058 Other specified cough: Secondary | ICD-10-CM

## 2018-04-26 NOTE — Progress Notes (Signed)
LMTCB

## 2018-04-26 NOTE — Telephone Encounter (Signed)
Called preferred number and left message for pt to return our call.     Tanda Rockers, MD  Rosana Berger, CMA        Call patient : Study did show minor issues like a small hernia but nothing to change immediate recs : Be sure patient has/keeps f/u ov so we can go over all the details of this study and get a plan together moving forward - ok to move up f/u if not feeling better and wants to be seen sooner

## 2018-04-26 NOTE — Telephone Encounter (Signed)
Manuela Schwartz, patient's wife, returned call.  CB is [phone].

## 2018-04-26 NOTE — Telephone Encounter (Signed)
Pt's spouse, Susan(DPR) is aware of results and voiced her understanding. Pt has pending OV for 05/10/17 with MW. Nothing further is needed.

## 2018-05-10 ENCOUNTER — Ambulatory Visit: Payer: Medicare Other | Admitting: Internal Medicine

## 2018-05-10 ENCOUNTER — Encounter: Payer: Self-pay | Admitting: Internal Medicine

## 2018-05-10 VITALS — BP 130/82 | HR 59 | Ht 72.0 in | Wt 231.0 lb

## 2018-05-10 DIAGNOSIS — R05 Cough: Secondary | ICD-10-CM

## 2018-05-10 DIAGNOSIS — R058 Other specified cough: Secondary | ICD-10-CM

## 2018-05-10 DIAGNOSIS — R911 Solitary pulmonary nodule: Secondary | ICD-10-CM

## 2018-05-10 DIAGNOSIS — R059 Cough, unspecified: Secondary | ICD-10-CM

## 2018-05-10 HISTORY — DX: Solitary pulmonary nodule: R91.1

## 2018-05-10 MED ORDER — MONTELUKAST SODIUM 10 MG PO TABS: 10.0000 mg | ORAL_TABLET | Freq: Every day | ORAL | 1 refills | Status: DC

## 2018-05-10 NOTE — Assessment & Plan Note (Signed)
Onset was summer 2018    Prior GI eval 08/2017 egd c/w HH and was dilated empirically  - Spirometry 04/19/2018  FEV1 2.6 (83%)  Ratio 73 s curvature  s prior rx  - FENO 04/19/2018  =   30  - Allergy profile 04/19/2018 >  Eos 0.4 /  IgE  206 RAST neg  - Dg Es 04/26/2018 1. Small type 1 hiatal hernia. 2. Mild smooth narrowing of the distal esophagus, without irregularity, maximum distended diameter bout 14 mm. A 13 mm barium tablet did pass through this area without difficulty. Although there is no irregularity to suggest malignancy or ulcer, this smooth narrowing could potentially cause symptoms of food sticking. 3. Vallecular and piriform pooling after swallows, otherwise unremarkable pharyngeal phase of swallowing.   - Sinus CT ordered 05/10/2018 based on response to h1 better than anything he's ever tried and d/c hs pepcid/ atrovent ns on  A reverse trial basis  Cautioned:  NB the  ramp to expected improvement in symptoms from an empiric trial for cough  (and for that matter, worsening, if a chronic effective medication is stopped)  can be measured in weeks, not days, a common misconception because this is not the same as treating heartburn (no immediate cause and effect relationship)  so that response to therapy or lack thereof can be very difficult to assess especially if the patient is not adherent to the treatment plan which includes dietary restrictions for gerd  If no change off those two meds then ok to try off singulair p a month

## 2018-05-10 NOTE — Assessment & Plan Note (Signed)
Chest CT 02/28/18 1) enchondroma R lat 9th rib stable since 2009  2) 9 mm RLL well circ rec 6-12 m f/u   Never smoker, well circ nodule > rec 72 m f/u placed in reminder file  Discussed in detail all the  indications, usual  risks and alternatives  relative to the benefits with patient who agrees to proceed with conservative f/u as outlined     I had an extended discussion with the patient and wife  reviewing all relevant studies completed to date and  lasting 15 to 20 minutes of a 25 minute visit    Each maintenance medication was reviewed in detail including most importantly the difference between maintenance and prns and under what circumstances the prns are to be triggered using an action plan format that is not reflected in the computer generated alphabetically organized AVS.     Please see AVS for specific instructions unique to this visit that I personally wrote and verbalized to the the pt in detail and then reviewed with pt  by my nurse highlighting any  changes in therapy recommended at today's visit to their plan of care.

## 2018-05-10 NOTE — Progress Notes (Signed)
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 Previous eval by Kozlow reported cough x 2009 but pt says this was misunderstanding and Kozlow rec ent  eval which pt says was negative and not helpful all occurring in the fall of 2019   Prior GI eval 08/2017 egd c/w HH and was dilated empirically      History of Present Illness  04/19/2018  Pulmonary/ 1st office eval/Jonah Nestle  Chief Complaint  Patient presents with  . Pulmonary Consult    Referred by Dr. Bertram Millard.  Pt c/o cough x 18 months, worse over the past 3-4 months. His cough is prod, esp in the am with yellow sputum.    Dyspnea:  Not limited by breathing from desired activities   Cough:  Worse first thing am and prod of sev tbsp of yellow mucus / no resp to gerd rx /pred/abx or anoro per pt and notes from Dr Chancy Milroy "not ever a smidge, not even for a short period of time (Dr Bruna Potter notes says 90% improved from rx gerd, singulair stop fish oil and excess coffee) Sleep: hob  6 inches or recliner makes no difference  SABA use: none  rec Take your acid suppression but take prilosec 40 mg Take 30- 60 min before your first and last meals of the day  Prednisone 10 mg take  4 each am x 2 days,   2 each am x 2 days,  1 each am x 2 days and stop For drainage / throat tickle   try take CHLORPHENIRAMINE  4 mg (chlortabs at [facility]) - take one every 4 hours as needed   Please remember to go to the lab department    for your tests - we will call you with the results when they are available.    05/10/2018  f/u ov/Yifan Auker re: cough x summer 2018 with neg allergy/ ent eval better since added H1 Chief Complaint  Patient presents with  . Follow-up    cough getting better, little cough, mucus gets stuck in throat, in AM light yellow mucus  Dyspnea:  Not limited by breathing from desired activities  /very active Cough: still w/in 15 min of waking Sleeping: in recliner x years may but says "only 4 in" SABA use: no inhalers  02: no    No obvious day to day or daytime variability or assoc excess/ purulent sputum or mucus plugs or hemoptysis or cp or chest tightness, subjective wheeze or overt sinus or hb symptoms.   Sleeping as above without nocturnal  exacerbation  of respiratory  c/o's or need for noct saba. Also denies any obvious fluctuation of symptoms with weather or environmental changes or other aggravating or alleviating factors except as outlined above   No unusual exposure hx or h/o childhood pna/ asthma or knowledge of premature birth.  Current Allergies, Complete Past Medical History, Past  Surgical History, Family History, and Social History were reviewed in [employer] record.  ROS  The following are not active complaints unless bolded Hoarseness, sore throat, dysphagia=large pills get stuck in throat, dental problems, itching, sneezing,  nasal congestion or discharge of excess mucus or purulent secretions, ear ache,   fever, chills, sweats, unintended wt loss or wt gain, classically pleuritic or exertional cp,  orthopnea pnd or arm/hand swelling  or leg swelling, presyncope, palpitations, abdominal pain, anorexia, nausea, vomiting, diarrhea  or change in bowel habits or change in bladder habits, change in stools or change in urine, dysuria, hematuria,  rash, arthralgias, visual complaints, headache, numbness, weakness or ataxia or problems with walking or coordination,  change in  mood or  memory.        Current Meds  Medication Sig  . amLODipine (NORVASC) 5 MG tablet Take 5 mg by mouth daily.  Marland Kitchen atenolol (TENORMIN) 25 MG tablet Take 25 mg by mouth daily.  . candesartan-hydrochlorothiazide (ATACAND HCT) 16-12.5 MG tablet Take 1 tablet by mouth daily.  . famotidine (PEPCID) 40 MG tablet Take 1 tablet (40 mg total) by mouth at bedtime.  . Ferrous Sulfate (IRON) 325 (65 Fe) MG TABS Take 1 tablet by mouth daily.  . montelukast (SINGULAIR) 10 MG tablet Take 1 tablet (10 mg total) by mouth at bedtime.  . Multiple Vitamins-Minerals (CENTRUM SILVER PO) Take by mouth daily.  Marland Kitchen omeprazole (PRILOSEC) 40 MG capsule Take one capsule by mouth twice daily as directed.  . [DISCONTINUED] predniSONE (DELTASONE) 10 MG tablet Take  4 each am x 2 days,   2 each am x 2 days,  1 each am x 2 days and stop                       Objective:       Wt Readings from Last 3 Encounters:  05/10/18 231 lb (104.8 kg)  04/19/18 236 lb 6.4 oz (107.2 kg)  02/21/18 236 lb (107 kg)     Vital signs reviewed - Note on arrival 02 sats  96% on RA       amb pleasant wm nad/ still rattling cough       HEENT: full dentures/ nl  oropharynx. Nl external ear canals without cough reflex - mild L > R  bilateral non-specific turbinate edema     NECK :  without JVD/Nodes/TM/ nl carotid upstrokes bilaterally   LUNGS: no acc muscle use,  Nl contour chest which is clear to A and P bilaterally without cough on insp or exp maneuvers   CV:  RRR  no s3 or murmur or increase in P2, and no edema   ABD:  soft and nontender with nl inspiratory excursion in the supine position. No bruits or organomegaly appreciated, bowel sounds nl  MS:  Nl gait/ ext warm without deformities, calf tenderness, cyanosis or clubbing No obvious joint restrictions   SKIN: warm and dry without lesions    NEURO:  alert, approp, nl sensorium with  no motor or cerebellar deficits apparent.        I personally reviewed  images and agree with radiology impression as follows:   Chest CT 02/28/18 1) enchondroma R lat 9th rib stable since 2009  2) 9 mm RLL well circ rec 6-12 m f/u         Assessment

## 2018-05-10 NOTE — Patient Instructions (Signed)
Please see patient coordinator before you leave today  to schedule sinus CT    For drainage / throat tickle try take CHLORPHENIRAMINE  4 mg - take one every 4 hours as needed - available over the counter- may cause drowsiness so start with just a bedtime dose or two and see how you tolerate it before trying in daytime     Stop pepcid and nasal spray now and then singulair in a month   Please schedule a follow up visit in 3 months but call sooner if needed  with all medications /inhalers/ solutions in hand so we can verify exactly what you are taking. This includes all medications from all doctors and over the counters

## 2018-05-14 ENCOUNTER — Ambulatory Visit: Payer: Medicare Other | Admitting: Allergy and Immunology

## 2018-05-15 ENCOUNTER — Telehealth: Payer: Self-pay | Admitting: Internal Medicine

## 2018-05-15 ENCOUNTER — Ambulatory Visit (INDEPENDENT_AMBULATORY_CARE_PROVIDER_SITE_OTHER)
Admission: RE | Admit: 2018-05-15 | Discharge: 2018-05-15 | Disposition: A | Discharge status: Home / Self Care | Payer: Medicare Other | Source: Ambulatory Visit | Attending: Internal Medicine | Admitting: Internal Medicine

## 2018-05-15 DIAGNOSIS — R059 Cough, unspecified: Secondary | ICD-10-CM

## 2018-05-15 DIAGNOSIS — R05 Cough: Secondary | ICD-10-CM | POA: Diagnosis not present

## 2018-05-15 DIAGNOSIS — R058 Other specified cough: Secondary | ICD-10-CM

## 2018-05-15 DIAGNOSIS — J342 Deviated nasal septum: Secondary | ICD-10-CM | POA: Diagnosis not present

## 2018-05-15 NOTE — Progress Notes (Signed)
LMTCB

## 2018-05-15 NOTE — Telephone Encounter (Signed)
Advised pt of results. Pt understood and nothing further is needed.    Notes recorded by Tanda Rockers, MD on 05/15/2018 at 1:26 PM EST Call patient : Study is unremarkable, no change in recs

## 2018-08-06 ENCOUNTER — Other Ambulatory Visit: Payer: Self-pay | Admitting: Allergy and Immunology

## 2018-08-10 ENCOUNTER — Ambulatory Visit: Payer: Medicare Other | Admitting: Internal Medicine

## 2018-08-21 DIAGNOSIS — C44229 Squamous cell carcinoma of skin of left ear and external auricular canal: Secondary | ICD-10-CM | POA: Diagnosis not present

## 2018-08-22 ENCOUNTER — Other Ambulatory Visit: Payer: Self-pay

## 2018-08-22 ENCOUNTER — Ambulatory Visit: Payer: Medicare Other | Admitting: Internal Medicine

## 2018-08-22 ENCOUNTER — Encounter: Payer: Self-pay | Admitting: Internal Medicine

## 2018-08-22 VITALS — BP 124/64 | HR 60 | Ht 72.0 in | Wt 236.0 lb

## 2018-08-22 DIAGNOSIS — R911 Solitary pulmonary nodule: Secondary | ICD-10-CM | POA: Diagnosis not present

## 2018-08-22 DIAGNOSIS — R05 Cough: Secondary | ICD-10-CM

## 2018-08-22 DIAGNOSIS — R058 Other specified cough: Secondary | ICD-10-CM

## 2018-08-22 NOTE — Assessment & Plan Note (Signed)
Onset was summer 2018    Prior GI eval 08/2017 egd c/w HH and was dilated empirically  - Spirometry 04/19/2018  FEV1 2.6 (83%)  Ratio 73 s curvature  s prior rx  - FENO 04/19/2018  =   30  - Allergy profile 04/19/2018 >  Eos 0.4 /  IgE  206 RAST neg  - Dg Es 04/26/2018 1. Small type 1 hiatal hernia. 2. Mild smooth narrowing of the distal esophagus, without irregularity, maximum distended diameter bout 14 mm. A 13 mm barium tablet did pass through this area without difficulty. Although there is no irregularity to suggest malignancy or ulcer, this smooth narrowing could potentially cause symptoms of food sticking. 3. Vallecular and piriform pooling after swallows, otherwise unremarkable pharyngeal phase of swallowing.  - 05/10/18 d/c pepcid, nasal sprays, singulair  - Sinus CT 05/15/2018  Clear paranasal sinuses.  Leftward septal deviation   08/22/2018 reported marked improvement on xyzal plus chlorpheniramine rec d/c xyzal    Should be fine without the more expensive antihistamine and also suggest he take 2 x chlorpheniramine at hs to see if helps with the am "throat congestion" and f/u here prn

## 2018-08-22 NOTE — Patient Instructions (Signed)
Stop levocetrazine and continue chlorpheniramine 4 mg 2 at bedtime and as needed during the day   We will contact you in November to schedule your follow up CT chest and follow up in this clinic is as needed

## 2018-08-22 NOTE — Progress Notes (Signed)
Ian Diaz, male    DOB: [birth year],    MRN: [id number]     Brief patient profile:  25 yowm  Quit smoking 1990  no symptoms at all worked at that point, worked in [employer], clinical and air but no unusual  Exp (mostly [place]) with new symptom of severe reflux to point of sore throat, regurgitation around 2017  new cough indolent onset spring/summer of 2018 worse x late summer 2019 referred to pulmonary clinic 04/19/2018 by Dr   Chancy Milroy for refractory cough  - note changed bp meds to atacand 12/2017 after years of being on another bp med he doesn't know name but cough no better since change. Previous eval by Kozlow reported cough x 2009 but pt says this was misunderstanding and Kozlow rec ent  eval which pt says was negative and not helpful all occurring in the fall of 2019   Prior GI eval 08/2017 egd c/w HH and was dilated empirically      History of Present Illness  04/19/2018  Pulmonary/ 1st office eval/Oseph Imburgia  Chief Complaint  Patient presents with  . Pulmonary Consult    Referred by Dr. Bertram Millard.  Pt c/o cough x 18 months, worse over the past 3-4 months. His cough is prod, esp in the am with yellow sputum.    Dyspnea:  Not limited by breathing from desired activities   Cough:  Worse first thing am and prod of sev tbsp of yellow mucus / no resp to gerd rx /pred/abx or anoro per pt and notes from Dr Chancy Milroy "not ever a smidge, not even for a short period of time (Dr Bruna Potter notes says 90% improved from rx gerd, singulair stop fish oil and excess coffee) Sleep: hob  6 inches or recliner makes no difference  SABA use: none  rec Take your acid suppression but take prilosec 40 mg Take 30- 60 min before your first and last meals of the day  Prednisone 10 mg take  4 each am x 2 days,   2 each am x 2 days,  1 each am x 2 days and stop For drainage / throat tickle   try take CHLORPHENIRAMINE  4 mg (chlortabs at [facility]) - take one every 4 hours as needed   Please remember to go to the lab department    for your tests - we will call you with the results when they are available.    05/10/2018  f/u ov/Curtez Brallier re: cough x summer 2018 with neg allergy/ ent eval better since added H1 Chief Complaint  Patient presents with  . Follow-up    cough getting better, little cough, mucus gets stuck in throat, in AM light yellow mucus  Dyspnea:  Not limited by breathing from desired activities  /very active Cough: still w/in 15 min of waking Sleeping: in recliner x years may but says "only 4 in" SABA use: no inhalers  rec  schedule sinus CT >nl 05/15/18  For drainage / throat tickle try take CHLORPHENIRAMINE  4 mg - take one every 4 hours as needed - Stop pepcid and nasal spray now and then singulair in a month Please schedule a follow up visit in 3 months but call sooner if needed  with all medications /inhalers/ solutions in hand so we can verify exactly what you are taking. This includes all medications from all doctors and over the counters     08/22/2018  f/u ov/Sparrow Sanzo re: cough improved  "99%" Chief Complaint  Patient presents with  .  Follow-up    Cough is "basically gone". No new co's.   Dyspnea:  Not limited by breathing from desired activities   Cough: much better p rx 1st gen H1 blockers per guidelines using about 3 or 4 per day plus xyzal each am but finds the 1st gen working better   Sleeping: fine / some am throat congestion/ nasal drainage  SABA use: inhalers  02: none   No obvious day to day or daytime variability or assoc excess/ purulent sputum or mucus plugs or hemoptysis or cp or chest tightness, subjective wheeze or overt sinus or hb symptoms.   sleeping without nocturnal   exacerbation  of respiratory  c/o's or need for noct saba. Also denies any obvious fluctuation of symptoms with weather or environmental changes or other aggravating or alleviating factors except as outlined above   No unusual exposure hx or h/o childhood pna/ asthma or knowledge of premature birth.  Current  Allergies, Complete Past Medical History, Past Surgical History, Family History, and Social History were reviewed in [employer] record.  ROS  The following are not active complaints unless bolded Hoarseness, sore throat, dysphagia, dental problems, itching, sneezing,  nasal congestion or discharge of excess mucus or purulent secretions, ear ache,   fever, chills, sweats, unintended wt loss or wt gain, classically pleuritic or exertional cp,  orthopnea pnd or arm/hand swelling  or leg swelling, presyncope, palpitations, abdominal pain, anorexia, nausea, vomiting, diarrhea  or change in bowel habits or change in bladder habits, change in stools or change in urine, dysuria, hematuria,  rash, arthralgias, visual complaints, headache, numbness, weakness or ataxia or problems with walking or coordination,  change in mood or  memory.        Current Meds  Medication Sig  . amLODipine (NORVASC) 5 MG tablet Take 5 mg by mouth daily.  Marland Kitchen atenolol (TENORMIN) 25 MG tablet Take 25 mg by mouth daily.  . candesartan-hydrochlorothiazide (ATACAND HCT) 16-12.5 MG tablet Take 1 tablet by mouth daily.  . chlorpheniramine (CHLOR-TRIMETON) 4 MG tablet Take 4 mg by mouth every 4 (four) hours as needed for allergies.  . Ferrous Sulfate (IRON) 325 (65 Fe) MG TABS Take 1 tablet by mouth daily.  Marland Kitchen levocetirizine (XYZAL) 5 MG tablet Take 5 mg by mouth every evening.  . montelukast (SINGULAIR) 10 MG tablet Take 1 tablet (10 mg total) by mouth at bedtime.  . Multiple Vitamins-Minerals (CENTRUM SILVER PO) Take by mouth daily.  Marland Kitchen omeprazole (PRILOSEC) 40 MG capsule TAKE ONE CAPSULE BY MOUTH  TWICE DAILY AS DIRECTED.               Objective:        08/22/2018        236   05/10/18 231 lb (104.8 kg)  04/19/18 236 lb 6.4 oz (107.2 kg)  02/21/18 236 lb (107 kg)        amb pleasant wm nad  All smiles  Vital signs reviewed - Note on arrival 02 sats  95% on RA     HEENT:full dentures / nl   turbinates bilaterally, and oropharynx. Nl external ear canals without cough reflex   NECK :  without JVD/Nodes/TM/ nl carotid upstrokes bilaterally   LUNGS: no acc muscle use,  Nl contour chest which is clear to A and P bilaterally without cough on insp or exp maneuvers   CV:  RRR  no s3 or murmur or increase in P2, and no edema   ABD:  soft  and nontender with nl inspiratory excursion in the supine position. No bruits or organomegaly appreciated, bowel sounds nl  MS:  Nl gait/ ext warm without deformities, calf tenderness, cyanosis or clubbing No obvious joint restrictions   SKIN: warm and dry without lesions    NEURO:  alert, approp, nl sensorium with  no motor or cerebellar deficits apparent.              Assessment

## 2018-08-22 NOTE — Assessment & Plan Note (Addendum)
Chest CT 02/28/18 1) enchondroma R lat 9th rib stable since 2009  2) 9 mm RLL well circ rec 6-12 m f/u> placed in computer for 02/26/2019 recall  Given covid 19 exposure issue rec he wait until the 12 month due 02/2019 > placed in reminder file  Discussed in detail all the  indications, usual  risks and alternatives  relative to the benefits with patient who agrees to proceed with w/u as outlined.      I had an extended discussion with the patient reviewing all relevant studies completed to date and  lasting 15 to 20 minutes of a 25 minute visit    Each maintenance medication was reviewed in detail including most importantly the difference between maintenance and prns and under what circumstances the prns are to be triggered using an action plan format that is not reflected in the computer generated alphabetically organized AVS.     Please see AVS for specific instructions unique to this visit that I personally wrote and verbalized to the the pt in detail and then reviewed with pt  by my nurse highlighting any  changes in therapy recommended at today's visit to their plan of care.

## 2018-10-09 DIAGNOSIS — D509 Iron deficiency anemia, unspecified: Secondary | ICD-10-CM | POA: Diagnosis not present

## 2018-10-09 DIAGNOSIS — J309 Allergic rhinitis, unspecified: Secondary | ICD-10-CM | POA: Diagnosis not present

## 2018-10-09 DIAGNOSIS — K219 Gastro-esophageal reflux disease without esophagitis: Secondary | ICD-10-CM | POA: Diagnosis not present

## 2018-10-09 DIAGNOSIS — D519 Vitamin B12 deficiency anemia, unspecified: Secondary | ICD-10-CM | POA: Diagnosis not present

## 2018-10-09 DIAGNOSIS — Z79899 Other long term (current) drug therapy: Secondary | ICD-10-CM | POA: Diagnosis not present

## 2018-10-09 DIAGNOSIS — I10 Essential (primary) hypertension: Secondary | ICD-10-CM | POA: Diagnosis not present

## 2018-10-09 DIAGNOSIS — Z0000 Encounter for general adult medical examination without abnormal findings: Secondary | ICD-10-CM | POA: Diagnosis not present

## 2018-11-09 ENCOUNTER — Ambulatory Visit: Payer: Medicare Other | Admitting: Internal Medicine

## 2018-11-20 ENCOUNTER — Ambulatory Visit: Payer: Medicare Other | Admitting: Internal Medicine

## 2018-11-20 ENCOUNTER — Encounter: Payer: Self-pay | Admitting: Internal Medicine

## 2018-11-20 ENCOUNTER — Other Ambulatory Visit: Payer: Self-pay

## 2018-11-20 DIAGNOSIS — R911 Solitary pulmonary nodule: Secondary | ICD-10-CM

## 2018-11-20 DIAGNOSIS — R05 Cough: Secondary | ICD-10-CM | POA: Diagnosis not present

## 2018-11-20 DIAGNOSIS — R058 Other specified cough: Secondary | ICD-10-CM

## 2018-11-20 NOTE — Progress Notes (Signed)
Ian Diaz, male    DOB: 17-Feb-1936,    MRN: [id number]     Brief patient profile:  24 yowm  Quit smoking 1990  no symptoms at all worked at that point, worked in [employer], clinical and air but no unusual  Exp (mostly [place]) with new symptom of severe reflux to point of sore throat, regurgitation around 2017  new cough indolent onset spring/summer of 2018 worse x late summer 2019 referred to pulmonary clinic 04/19/2018 by Dr   Chancy Milroy for refractory cough  - note changed bp meds to atacand 12/2017 after years of being on another bp med he doesn't know name but cough no better since change. Previous eval by Kozlow reported cough x 2009 but pt says this was misunderstanding and Kozlow rec ent  eval which pt says was negative and not helpful all occurring in the fall of 2019   Prior GI eval 08/2017 egd c/w HH and was dilated empirically      History of Present Illness  04/19/2018  Pulmonary/ 1st office eval/Annelyse Rey  Chief Complaint  Patient presents with  . Pulmonary Consult    Referred by Dr. Bertram Millard.  Pt c/o cough x 18 months, worse over the past 3-4 months. His cough is prod, esp in the am with yellow sputum.    Dyspnea:  Not limited by breathing from desired activities   Cough:  Worse first thing am and prod of sev tbsp of yellow mucus / no resp to gerd rx /pred/abx or anoro per pt and notes from Dr Chancy Milroy "not ever a smidge, not even for a short period of time (Dr Bruna Potter notes says 90% improved from rx gerd, singulair stop fish oil and excess coffee) Sleep: hob  6 inches or recliner makes no difference  SABA use: none  rec Take your acid suppression but take prilosec 40 mg Take 30- 60 min before your first and last meals of the day  Prednisone 10 mg take  4 each am x 2 days,   2 each am x 2 days,  1 each am x 2 days and stop For drainage / throat tickle   try take CHLORPHENIRAMINE  4 mg ([facility]abs at [facility]) - take one every 4 hours as needed   Please remember to go to the lab department    for your tests - we will call you with the results when they are available.    05/10/2018  f/u ov/Geisha Abernathy re: cough x summer 2018 with neg allergy/ ent eval better since added H1 Chief Complaint  Patient presents with  . Follow-up    cough getting better, little cough, mucus gets stuck in throat, in AM light yellow mucus  Dyspnea:  Not limited by breathing from desired activities  /very active Cough: still w/in 15 min of waking Sleeping: in recliner x years may but says "only 4 in" SABA use: no inhalers  rec  schedule sinus CT >nl 05/15/18  For drainage / throat tickle try take CHLORPHENIRAMINE  4 mg - take one every 4 hours as needed - Stop pepcid and nasal spray now and then singulair in a month Please schedule a follow up visit in 3 months but call sooner if needed  with all medications /inhalers/ solutions in hand so we can verify exactly what you are taking. This includes all medications from all doctors and over the counters     08/22/2018  f/u ov/Patryk Conant re: cough improved  "99%" off singulair  Chief Complaint  Patient presents with  . Follow-up    Cough is "basically gone". No new co's.   Dyspnea:  Not limited by breathing from desired activities   Cough: much better p rx 1st gen H1 blockers per guidelines using about 3 or 4 per day plus xyzal each am but finds the 1st gen working better   Sleeping: fine / some am throat congestion/ nasal drainage  SABA use: inhalers   rec Stop levocetrazine and continue chlorpheniramine 4 mg 2 at bedtime and as needed during the day   11/20/2018  f/u ov/Cheyne Bungert re: uacs  Chief Complaint  Patient presents with  . Follow-up    Still has some throat clearing.  Dyspnea:  Not limited by breathing from desired activities   Cough: variably/assoc with throat clearing better on when takes h1 but uses only a few in daytime / does not make him sleepy  Sleeping: still in recliner and no change flat when he does lie down now does better SABA use: none 02:  none    No obvious day to day or daytime variability or assoc excess/ purulent sputum or mucus plugs or hemoptysis or cp or chest tightness, subjective wheeze or overt sinus or hb symptoms.   Sleeping as abov e without nocturnal  or early am exacerbation  of respiratory  c/o's or need for noct saba. Also denies any obvious fluctuation of symptoms with weather or environmental changes or other aggravating or alleviating factors except as outlined above   No unusual exposure hx or h/o childhood pna/ asthma or knowledge of premature birth.  Current Allergies, Complete Past Medical History, Past Surgical History, Family History, and Social History were reviewed in [employer] record.  ROS  The following are not active complaints unless bolded Hoarseness, sore throat, dysphagia, dental problems, itching, sneezing,  nasal congestion or discharge of excess mucus or purulent secretions, ear ache,   fever, chills, sweats, unintended wt loss or wt gain, classically pleuritic or exertional cp,  orthopnea pnd or arm/hand swelling  or leg swelling, presyncope, palpitations, abdominal pain, anorexia, nausea, vomiting, diarrhea  or change in bowel habits or change in bladder habits, change in stools or change in urine, dysuria, hematuria,  rash, arthralgias, visual complaints, headache, numbness, weakness or ataxia or problems with walking or coordination,  change in mood or  memory.        Current Meds  Medication Sig  . amLODipine (NORVASC) 5 MG tablet Take 5 mg by mouth daily.  Marland Kitchen atenolol (TENORMIN) 25 MG tablet Take 25 mg by mouth daily.  . candesartan-hydrochlorothiazide (ATACAND HCT) 16-12.5 MG tablet Take 1 tablet by mouth daily.  . chlorpheniramine (CHLOR-TRIMETON) 4 MG tablet Take 4 mg by mouth every 4 (four) hours as needed for allergies.  . Ferrous Sulfate (IRON) 325 (65 Fe) MG TABS Take 1 tablet by mouth daily.  Marland Kitchen omeprazole (PRILOSEC) 40 MG capsule TAKE ONE CAPSULE BY MOUTH   TWICE DAILY AS DIRECTED.                Objective:      11/20/2018        235   08/22/2018        236   05/10/18 231 lb (104.8 kg)  04/19/18 236 lb 6.4 oz (107.2 kg)  02/21/18 236 lb (107 kg)    Pleasant amb wm / some rattling cough/     Vital signs reviewed - Note on arrival 02 sats  96% on RA  HEENT: nl dentition, turbinates bilaterally, and oropharynx. Nl external ear canals without cough reflex   NECK :  without JVD/Nodes/TM/ nl carotid upstrokes bilaterally   LUNGS: no acc muscle use,  Nl contour chest which is clear to A and P bilaterally without cough on insp or exp maneuvers   CV:  RRR  no s3 or murmur or increase in P2, and no edema   ABD:  soft and nontender with nl inspiratory excursion in the supine position. No bruits or organomegaly appreciated, bowel sounds nl  MS:  Nl gait/ ext warm without deformities, calf tenderness, cyanosis or clubbing No obvious joint restrictions   SKIN: warm and dry without lesions    NEURO:  alert, approp, nl sensorium with  no motor or cerebellar deficits apparent.              Assessment

## 2018-11-20 NOTE — Patient Instructions (Addendum)
Ok to adjust your chlortab during the day to see if it helps your drainage, cough, congestion   We will call you after you after your CT in November 2020   If you are satisfied with your treatment plan,  let your doctor know and he/she can either refill your medications or you can return here when your prescription runs out.     If in any way you are not 100% satisfied,  please tell us.  If 100% better, tell your friends!  Pulmonary follow up is as needed

## 2018-11-21 ENCOUNTER — Encounter: Payer: Self-pay | Admitting: Internal Medicine

## 2018-11-21 NOTE — Assessment & Plan Note (Addendum)
Onset was summer 2018    Prior GI eval 08/2017 egd c/w HH and was dilated empirically  - Spirometry 04/19/2018  FEV1 2.6 (83%)  Ratio 73 s curvature  s prior rx  - FENO 04/19/2018  =   30  - Allergy profile 04/19/2018 >  Eos 0.4 /  IgE  206 RAST neg  - Dg Es 04/26/2018 1. Small type 1 hiatal hernia. 2. Mild smooth narrowing of the distal esophagus, without irregularity, maximum distended diameter bout 14 mm. A 13 mm barium tablet did pass through this area without difficulty. Although there is no irregularity to suggest malignancy or ulcer, this smooth narrowing could potentially cause symptoms of food sticking. 3. Vallecular and piriform pooling after swallows, otherwise unremarkable pharyngeal phase of swallowing.  - 05/10/18 d/c pepcid, nasal sprays, singulair  - Sinus CT 05/15/2018  Clear paranasal sinuses.  Leftward septal deviation - 08/22/2018 reported marked improvement on xyzal plus chlorpheniramine rec d/c xyzal   Better with rx directed at [facility] with gerd rx and 1st gen H1 blockers per guidelines  - since not sleepy can certainly titrate up as needed to eliminate pnds. / consider allergy consult if not satisfied.   If satisfied all f/u can be per PCP and here prn

## 2018-11-21 NOTE — Assessment & Plan Note (Signed)
Chest CT 02/28/18 1) enchondroma R lat 9th rib stable since 2009  2) 9 mm RLL well circ rec 6-12 m f/u> placed in computer for 03/01/2019  Discussed in detail all the  indications, usual  risks and alternatives  relative to the benefits with patient who agrees to proceed with w/u as outlined.

## 2018-12-24 DIAGNOSIS — K521 Toxic gastroenteritis and colitis: Secondary | ICD-10-CM | POA: Diagnosis not present

## 2019-01-16 DIAGNOSIS — Z23 Encounter for immunization: Secondary | ICD-10-CM | POA: Diagnosis not present

## 2019-01-26 DIAGNOSIS — T1501XA Foreign body in cornea, right eye, initial encounter: Secondary | ICD-10-CM | POA: Diagnosis not present

## 2019-01-26 DIAGNOSIS — H5711 Ocular pain, right eye: Secondary | ICD-10-CM | POA: Diagnosis not present

## 2019-02-04 ENCOUNTER — Other Ambulatory Visit: Payer: Self-pay | Admitting: Allergy and Immunology

## 2019-02-28 ENCOUNTER — Other Ambulatory Visit: Payer: Self-pay | Admitting: Internal Medicine

## 2019-02-28 DIAGNOSIS — R911 Solitary pulmonary nodule: Secondary | ICD-10-CM

## 2019-02-28 DIAGNOSIS — R918 Other nonspecific abnormal finding of lung field: Secondary | ICD-10-CM

## 2019-03-05 ENCOUNTER — Other Ambulatory Visit: Payer: Self-pay

## 2019-03-05 ENCOUNTER — Telehealth: Payer: Self-pay | Admitting: Internal Medicine

## 2019-03-05 ENCOUNTER — Ambulatory Visit (INDEPENDENT_AMBULATORY_CARE_PROVIDER_SITE_OTHER)
Admission: RE | Admit: 2019-03-05 | Discharge: 2019-03-05 | Disposition: A | Discharge status: Home / Self Care | Payer: Medicare Other | Source: Ambulatory Visit | Attending: Internal Medicine | Admitting: Internal Medicine

## 2019-03-05 DIAGNOSIS — R911 Solitary pulmonary nodule: Secondary | ICD-10-CM | POA: Diagnosis not present

## 2019-03-05 NOTE — Progress Notes (Signed)
LMTCB

## 2019-03-05 NOTE — Telephone Encounter (Signed)
Called the patient back and made him aware of the chest CT results. Patient voiced understanding. Copy to be faxed to Dr. Humphrey Rolls with [facility] fax # [phone].

## 2019-03-07 DIAGNOSIS — Z7982 Long term (current) use of aspirin: Secondary | ICD-10-CM | POA: Diagnosis not present

## 2019-03-07 DIAGNOSIS — M7989 Other specified soft tissue disorders: Secondary | ICD-10-CM

## 2019-03-07 DIAGNOSIS — I2510 Atherosclerotic heart disease of native coronary artery without angina pectoris: Secondary | ICD-10-CM | POA: Diagnosis not present

## 2019-03-07 DIAGNOSIS — K219 Gastro-esophageal reflux disease without esophagitis: Secondary | ICD-10-CM | POA: Diagnosis not present

## 2019-03-07 DIAGNOSIS — R7303 Prediabetes: Secondary | ICD-10-CM | POA: Diagnosis not present

## 2019-03-07 DIAGNOSIS — R2241 Localized swelling, mass and lump, right lower limb: Secondary | ICD-10-CM | POA: Diagnosis not present

## 2019-03-07 DIAGNOSIS — I10 Essential (primary) hypertension: Secondary | ICD-10-CM | POA: Diagnosis not present

## 2019-03-07 DIAGNOSIS — I5030 Unspecified diastolic (congestive) heart failure: Secondary | ICD-10-CM | POA: Diagnosis not present

## 2019-03-07 DIAGNOSIS — I110 Hypertensive heart disease with heart failure: Secondary | ICD-10-CM | POA: Diagnosis not present

## 2019-03-07 DIAGNOSIS — Z955 Presence of coronary angioplasty implant and graft: Secondary | ICD-10-CM | POA: Diagnosis not present

## 2019-03-07 DIAGNOSIS — R079 Chest pain, unspecified: Secondary | ICD-10-CM | POA: Insufficient documentation

## 2019-03-07 DIAGNOSIS — I272 Pulmonary hypertension, unspecified: Secondary | ICD-10-CM | POA: Diagnosis not present

## 2019-03-07 DIAGNOSIS — I160 Hypertensive urgency: Secondary | ICD-10-CM | POA: Diagnosis not present

## 2019-03-07 DIAGNOSIS — Z87891 Personal history of nicotine dependence: Secondary | ICD-10-CM | POA: Diagnosis not present

## 2019-03-07 DIAGNOSIS — R0602 Shortness of breath: Secondary | ICD-10-CM | POA: Diagnosis not present

## 2019-03-07 DIAGNOSIS — I214 Non-ST elevation (NSTEMI) myocardial infarction: Secondary | ICD-10-CM

## 2019-03-07 DIAGNOSIS — R0609 Other forms of dyspnea: Secondary | ICD-10-CM | POA: Diagnosis not present

## 2019-03-07 DIAGNOSIS — R600 Localized edema: Secondary | ICD-10-CM | POA: Diagnosis not present

## 2019-03-07 DIAGNOSIS — I25119 Atherosclerotic heart disease of native coronary artery with unspecified angina pectoris: Secondary | ICD-10-CM | POA: Diagnosis not present

## 2019-03-07 DIAGNOSIS — R072 Precordial pain: Secondary | ICD-10-CM | POA: Diagnosis not present

## 2019-03-07 DIAGNOSIS — Z79899 Other long term (current) drug therapy: Secondary | ICD-10-CM | POA: Diagnosis not present

## 2019-03-07 HISTORY — DX: Other specified soft tissue disorders: M79.89

## 2019-03-07 HISTORY — DX: Chest pain, unspecified: R07.9

## 2019-03-07 HISTORY — DX: Non-ST elevation (NSTEMI) myocardial infarction: I21.4

## 2019-03-09 DIAGNOSIS — I2510 Atherosclerotic heart disease of native coronary artery without angina pectoris: Secondary | ICD-10-CM | POA: Insufficient documentation

## 2019-03-09 HISTORY — DX: Atherosclerotic heart disease of native coronary artery without angina pectoris: I25.10

## 2019-03-12 DIAGNOSIS — E782 Mixed hyperlipidemia: Secondary | ICD-10-CM | POA: Insufficient documentation

## 2019-03-12 DIAGNOSIS — I272 Pulmonary hypertension, unspecified: Secondary | ICD-10-CM | POA: Insufficient documentation

## 2019-03-12 DIAGNOSIS — Z7982 Long term (current) use of aspirin: Secondary | ICD-10-CM

## 2019-03-12 HISTORY — DX: Mixed hyperlipidemia: E78.2

## 2019-03-12 HISTORY — DX: Pulmonary hypertension, unspecified: I27.20

## 2019-03-12 HISTORY — DX: Long term (current) use of aspirin: Z79.82

## 2019-03-13 DIAGNOSIS — E7800 Pure hypercholesterolemia, unspecified: Secondary | ICD-10-CM | POA: Diagnosis not present

## 2019-03-13 DIAGNOSIS — I2510 Atherosclerotic heart disease of native coronary artery without angina pectoris: Secondary | ICD-10-CM | POA: Diagnosis not present

## 2019-03-13 DIAGNOSIS — I10 Essential (primary) hypertension: Secondary | ICD-10-CM | POA: Diagnosis not present

## 2019-03-19 DIAGNOSIS — I272 Pulmonary hypertension, unspecified: Secondary | ICD-10-CM | POA: Diagnosis not present

## 2019-03-19 DIAGNOSIS — Z87891 Personal history of nicotine dependence: Secondary | ICD-10-CM | POA: Insufficient documentation

## 2019-03-19 DIAGNOSIS — I2510 Atherosclerotic heart disease of native coronary artery without angina pectoris: Secondary | ICD-10-CM | POA: Diagnosis not present

## 2019-03-19 DIAGNOSIS — Z7982 Long term (current) use of aspirin: Secondary | ICD-10-CM | POA: Diagnosis not present

## 2019-03-19 DIAGNOSIS — E782 Mixed hyperlipidemia: Secondary | ICD-10-CM | POA: Diagnosis not present

## 2019-03-19 DIAGNOSIS — I10 Essential (primary) hypertension: Secondary | ICD-10-CM | POA: Diagnosis not present

## 2019-03-19 HISTORY — DX: Personal history of nicotine dependence: Z87.891

## 2019-04-16 DIAGNOSIS — I272 Pulmonary hypertension, unspecified: Secondary | ICD-10-CM | POA: Diagnosis not present

## 2019-04-16 DIAGNOSIS — I10 Essential (primary) hypertension: Secondary | ICD-10-CM | POA: Diagnosis not present

## 2019-04-16 DIAGNOSIS — E782 Mixed hyperlipidemia: Secondary | ICD-10-CM | POA: Diagnosis not present

## 2019-04-16 DIAGNOSIS — Z7982 Long term (current) use of aspirin: Secondary | ICD-10-CM | POA: Diagnosis not present

## 2019-04-16 DIAGNOSIS — I2510 Atherosclerotic heart disease of native coronary artery without angina pectoris: Secondary | ICD-10-CM | POA: Diagnosis not present

## 2019-04-17 ENCOUNTER — Other Ambulatory Visit: Payer: Self-pay | Admitting: Allergy and Immunology

## 2019-04-26 DIAGNOSIS — I2510 Atherosclerotic heart disease of native coronary artery without angina pectoris: Secondary | ICD-10-CM | POA: Diagnosis not present

## 2019-04-26 DIAGNOSIS — R0600 Dyspnea, unspecified: Secondary | ICD-10-CM | POA: Diagnosis not present

## 2019-06-19 DIAGNOSIS — S81012A Laceration without foreign body, left knee, initial encounter: Secondary | ICD-10-CM | POA: Diagnosis not present

## 2019-06-25 DIAGNOSIS — D509 Iron deficiency anemia, unspecified: Secondary | ICD-10-CM | POA: Diagnosis not present

## 2019-06-25 DIAGNOSIS — M542 Cervicalgia: Secondary | ICD-10-CM | POA: Diagnosis not present

## 2019-07-16 DIAGNOSIS — Z7982 Long term (current) use of aspirin: Secondary | ICD-10-CM | POA: Diagnosis not present

## 2019-07-16 DIAGNOSIS — I2510 Atherosclerotic heart disease of native coronary artery without angina pectoris: Secondary | ICD-10-CM | POA: Diagnosis not present

## 2019-07-16 DIAGNOSIS — E782 Mixed hyperlipidemia: Secondary | ICD-10-CM | POA: Diagnosis not present

## 2019-07-16 DIAGNOSIS — I272 Pulmonary hypertension, unspecified: Secondary | ICD-10-CM | POA: Diagnosis not present

## 2019-07-16 DIAGNOSIS — I10 Essential (primary) hypertension: Secondary | ICD-10-CM | POA: Diagnosis not present

## 2019-07-31 DIAGNOSIS — R05 Cough: Secondary | ICD-10-CM | POA: Diagnosis not present

## 2019-07-31 DIAGNOSIS — M542 Cervicalgia: Secondary | ICD-10-CM | POA: Diagnosis not present

## 2019-07-31 DIAGNOSIS — D509 Iron deficiency anemia, unspecified: Secondary | ICD-10-CM | POA: Diagnosis not present

## 2019-08-16 DIAGNOSIS — M542 Cervicalgia: Secondary | ICD-10-CM | POA: Diagnosis not present

## 2019-08-16 DIAGNOSIS — I2510 Atherosclerotic heart disease of native coronary artery without angina pectoris: Secondary | ICD-10-CM | POA: Diagnosis not present

## 2019-08-21 DIAGNOSIS — M4312 Spondylolisthesis, cervical region: Secondary | ICD-10-CM | POA: Diagnosis not present

## 2019-08-21 DIAGNOSIS — M50322 Other cervical disc degeneration at C5-C6 level: Secondary | ICD-10-CM | POA: Diagnosis not present

## 2019-08-21 DIAGNOSIS — M9901 Segmental and somatic dysfunction of cervical region: Secondary | ICD-10-CM | POA: Diagnosis not present

## 2019-08-21 DIAGNOSIS — M5031 Other cervical disc degeneration,  high cervical region: Secondary | ICD-10-CM | POA: Diagnosis not present

## 2019-08-21 DIAGNOSIS — M50321 Other cervical disc degeneration at C4-C5 level: Secondary | ICD-10-CM | POA: Diagnosis not present

## 2019-08-22 DIAGNOSIS — M5031 Other cervical disc degeneration,  high cervical region: Secondary | ICD-10-CM | POA: Diagnosis not present

## 2019-08-22 DIAGNOSIS — M50321 Other cervical disc degeneration at C4-C5 level: Secondary | ICD-10-CM | POA: Diagnosis not present

## 2019-08-22 DIAGNOSIS — M4312 Spondylolisthesis, cervical region: Secondary | ICD-10-CM | POA: Diagnosis not present

## 2019-08-22 DIAGNOSIS — M9901 Segmental and somatic dysfunction of cervical region: Secondary | ICD-10-CM | POA: Diagnosis not present

## 2019-08-22 DIAGNOSIS — M50322 Other cervical disc degeneration at C5-C6 level: Secondary | ICD-10-CM | POA: Diagnosis not present

## 2019-08-26 DIAGNOSIS — M50321 Other cervical disc degeneration at C4-C5 level: Secondary | ICD-10-CM | POA: Diagnosis not present

## 2019-08-26 DIAGNOSIS — M4312 Spondylolisthesis, cervical region: Secondary | ICD-10-CM | POA: Diagnosis not present

## 2019-08-26 DIAGNOSIS — M5031 Other cervical disc degeneration,  high cervical region: Secondary | ICD-10-CM | POA: Diagnosis not present

## 2019-08-26 DIAGNOSIS — M50322 Other cervical disc degeneration at C5-C6 level: Secondary | ICD-10-CM | POA: Diagnosis not present

## 2019-08-26 DIAGNOSIS — M9901 Segmental and somatic dysfunction of cervical region: Secondary | ICD-10-CM | POA: Diagnosis not present

## 2019-08-27 DIAGNOSIS — M4312 Spondylolisthesis, cervical region: Secondary | ICD-10-CM | POA: Diagnosis not present

## 2019-08-27 DIAGNOSIS — M5031 Other cervical disc degeneration,  high cervical region: Secondary | ICD-10-CM | POA: Diagnosis not present

## 2019-08-27 DIAGNOSIS — M9901 Segmental and somatic dysfunction of cervical region: Secondary | ICD-10-CM | POA: Diagnosis not present

## 2019-08-27 DIAGNOSIS — M50322 Other cervical disc degeneration at C5-C6 level: Secondary | ICD-10-CM | POA: Diagnosis not present

## 2019-08-27 DIAGNOSIS — M50321 Other cervical disc degeneration at C4-C5 level: Secondary | ICD-10-CM | POA: Diagnosis not present

## 2019-08-29 DIAGNOSIS — M4312 Spondylolisthesis, cervical region: Secondary | ICD-10-CM | POA: Diagnosis not present

## 2019-08-29 DIAGNOSIS — M50321 Other cervical disc degeneration at C4-C5 level: Secondary | ICD-10-CM | POA: Diagnosis not present

## 2019-08-29 DIAGNOSIS — M5031 Other cervical disc degeneration,  high cervical region: Secondary | ICD-10-CM | POA: Diagnosis not present

## 2019-08-29 DIAGNOSIS — M9901 Segmental and somatic dysfunction of cervical region: Secondary | ICD-10-CM | POA: Diagnosis not present

## 2019-08-29 DIAGNOSIS — M50322 Other cervical disc degeneration at C5-C6 level: Secondary | ICD-10-CM | POA: Diagnosis not present

## 2019-09-02 DIAGNOSIS — M4312 Spondylolisthesis, cervical region: Secondary | ICD-10-CM | POA: Diagnosis not present

## 2019-09-02 DIAGNOSIS — M9901 Segmental and somatic dysfunction of cervical region: Secondary | ICD-10-CM | POA: Diagnosis not present

## 2019-09-02 DIAGNOSIS — M5031 Other cervical disc degeneration,  high cervical region: Secondary | ICD-10-CM | POA: Diagnosis not present

## 2019-09-02 DIAGNOSIS — M50322 Other cervical disc degeneration at C5-C6 level: Secondary | ICD-10-CM | POA: Diagnosis not present

## 2019-09-02 DIAGNOSIS — M50321 Other cervical disc degeneration at C4-C5 level: Secondary | ICD-10-CM | POA: Diagnosis not present

## 2019-09-03 DIAGNOSIS — M9901 Segmental and somatic dysfunction of cervical region: Secondary | ICD-10-CM | POA: Diagnosis not present

## 2019-09-03 DIAGNOSIS — M50322 Other cervical disc degeneration at C5-C6 level: Secondary | ICD-10-CM | POA: Diagnosis not present

## 2019-09-03 DIAGNOSIS — M50321 Other cervical disc degeneration at C4-C5 level: Secondary | ICD-10-CM | POA: Diagnosis not present

## 2019-09-03 DIAGNOSIS — M5031 Other cervical disc degeneration,  high cervical region: Secondary | ICD-10-CM | POA: Diagnosis not present

## 2019-09-03 DIAGNOSIS — M4312 Spondylolisthesis, cervical region: Secondary | ICD-10-CM | POA: Diagnosis not present

## 2019-09-05 DIAGNOSIS — M9901 Segmental and somatic dysfunction of cervical region: Secondary | ICD-10-CM | POA: Diagnosis not present

## 2019-09-05 DIAGNOSIS — M4312 Spondylolisthesis, cervical region: Secondary | ICD-10-CM | POA: Diagnosis not present

## 2019-09-05 DIAGNOSIS — M50322 Other cervical disc degeneration at C5-C6 level: Secondary | ICD-10-CM | POA: Diagnosis not present

## 2019-09-05 DIAGNOSIS — M5031 Other cervical disc degeneration,  high cervical region: Secondary | ICD-10-CM | POA: Diagnosis not present

## 2019-09-05 DIAGNOSIS — M50321 Other cervical disc degeneration at C4-C5 level: Secondary | ICD-10-CM | POA: Diagnosis not present

## 2019-09-09 DIAGNOSIS — M4312 Spondylolisthesis, cervical region: Secondary | ICD-10-CM | POA: Diagnosis not present

## 2019-09-09 DIAGNOSIS — M50322 Other cervical disc degeneration at C5-C6 level: Secondary | ICD-10-CM | POA: Diagnosis not present

## 2019-09-09 DIAGNOSIS — M50321 Other cervical disc degeneration at C4-C5 level: Secondary | ICD-10-CM | POA: Diagnosis not present

## 2019-09-09 DIAGNOSIS — M9901 Segmental and somatic dysfunction of cervical region: Secondary | ICD-10-CM | POA: Diagnosis not present

## 2019-09-09 DIAGNOSIS — M5031 Other cervical disc degeneration,  high cervical region: Secondary | ICD-10-CM | POA: Diagnosis not present

## 2019-09-10 DIAGNOSIS — M5031 Other cervical disc degeneration,  high cervical region: Secondary | ICD-10-CM | POA: Diagnosis not present

## 2019-09-10 DIAGNOSIS — M4312 Spondylolisthesis, cervical region: Secondary | ICD-10-CM | POA: Diagnosis not present

## 2019-09-10 DIAGNOSIS — M50321 Other cervical disc degeneration at C4-C5 level: Secondary | ICD-10-CM | POA: Diagnosis not present

## 2019-09-10 DIAGNOSIS — M50322 Other cervical disc degeneration at C5-C6 level: Secondary | ICD-10-CM | POA: Diagnosis not present

## 2019-09-10 DIAGNOSIS — M9901 Segmental and somatic dysfunction of cervical region: Secondary | ICD-10-CM | POA: Diagnosis not present

## 2019-09-12 DIAGNOSIS — M9901 Segmental and somatic dysfunction of cervical region: Secondary | ICD-10-CM | POA: Diagnosis not present

## 2019-09-12 DIAGNOSIS — M50321 Other cervical disc degeneration at C4-C5 level: Secondary | ICD-10-CM | POA: Diagnosis not present

## 2019-09-12 DIAGNOSIS — M5031 Other cervical disc degeneration,  high cervical region: Secondary | ICD-10-CM | POA: Diagnosis not present

## 2019-09-12 DIAGNOSIS — M50322 Other cervical disc degeneration at C5-C6 level: Secondary | ICD-10-CM | POA: Diagnosis not present

## 2019-09-12 DIAGNOSIS — M4312 Spondylolisthesis, cervical region: Secondary | ICD-10-CM | POA: Diagnosis not present

## 2019-09-13 DIAGNOSIS — Z7982 Long term (current) use of aspirin: Secondary | ICD-10-CM | POA: Diagnosis not present

## 2019-09-13 DIAGNOSIS — I272 Pulmonary hypertension, unspecified: Secondary | ICD-10-CM | POA: Diagnosis not present

## 2019-09-13 DIAGNOSIS — E782 Mixed hyperlipidemia: Secondary | ICD-10-CM | POA: Diagnosis not present

## 2019-09-13 DIAGNOSIS — I10 Essential (primary) hypertension: Secondary | ICD-10-CM | POA: Diagnosis not present

## 2019-09-13 DIAGNOSIS — I2510 Atherosclerotic heart disease of native coronary artery without angina pectoris: Secondary | ICD-10-CM | POA: Diagnosis not present

## 2019-09-16 DIAGNOSIS — M5031 Other cervical disc degeneration,  high cervical region: Secondary | ICD-10-CM | POA: Diagnosis not present

## 2019-09-16 DIAGNOSIS — M50322 Other cervical disc degeneration at C5-C6 level: Secondary | ICD-10-CM | POA: Diagnosis not present

## 2019-09-16 DIAGNOSIS — M9901 Segmental and somatic dysfunction of cervical region: Secondary | ICD-10-CM | POA: Diagnosis not present

## 2019-09-16 DIAGNOSIS — M50321 Other cervical disc degeneration at C4-C5 level: Secondary | ICD-10-CM | POA: Diagnosis not present

## 2019-09-16 DIAGNOSIS — M4312 Spondylolisthesis, cervical region: Secondary | ICD-10-CM | POA: Diagnosis not present

## 2019-09-17 DIAGNOSIS — M4312 Spondylolisthesis, cervical region: Secondary | ICD-10-CM | POA: Diagnosis not present

## 2019-09-17 DIAGNOSIS — M9901 Segmental and somatic dysfunction of cervical region: Secondary | ICD-10-CM | POA: Diagnosis not present

## 2019-09-17 DIAGNOSIS — M5031 Other cervical disc degeneration,  high cervical region: Secondary | ICD-10-CM | POA: Diagnosis not present

## 2019-09-17 DIAGNOSIS — M50322 Other cervical disc degeneration at C5-C6 level: Secondary | ICD-10-CM | POA: Diagnosis not present

## 2019-09-17 DIAGNOSIS — M50321 Other cervical disc degeneration at C4-C5 level: Secondary | ICD-10-CM | POA: Diagnosis not present

## 2019-09-19 DIAGNOSIS — M50321 Other cervical disc degeneration at C4-C5 level: Secondary | ICD-10-CM | POA: Diagnosis not present

## 2019-09-19 DIAGNOSIS — M5031 Other cervical disc degeneration,  high cervical region: Secondary | ICD-10-CM | POA: Diagnosis not present

## 2019-09-19 DIAGNOSIS — M9901 Segmental and somatic dysfunction of cervical region: Secondary | ICD-10-CM | POA: Diagnosis not present

## 2019-09-19 DIAGNOSIS — M4312 Spondylolisthesis, cervical region: Secondary | ICD-10-CM | POA: Diagnosis not present

## 2019-09-19 DIAGNOSIS — M50322 Other cervical disc degeneration at C5-C6 level: Secondary | ICD-10-CM | POA: Diagnosis not present

## 2019-09-24 DIAGNOSIS — M5031 Other cervical disc degeneration,  high cervical region: Secondary | ICD-10-CM | POA: Diagnosis not present

## 2019-09-24 DIAGNOSIS — M4312 Spondylolisthesis, cervical region: Secondary | ICD-10-CM | POA: Diagnosis not present

## 2019-09-24 DIAGNOSIS — M50321 Other cervical disc degeneration at C4-C5 level: Secondary | ICD-10-CM | POA: Diagnosis not present

## 2019-09-24 DIAGNOSIS — M9901 Segmental and somatic dysfunction of cervical region: Secondary | ICD-10-CM | POA: Diagnosis not present

## 2019-09-24 DIAGNOSIS — M50322 Other cervical disc degeneration at C5-C6 level: Secondary | ICD-10-CM | POA: Diagnosis not present

## 2019-09-26 DIAGNOSIS — M5031 Other cervical disc degeneration,  high cervical region: Secondary | ICD-10-CM | POA: Diagnosis not present

## 2019-09-26 DIAGNOSIS — M50321 Other cervical disc degeneration at C4-C5 level: Secondary | ICD-10-CM | POA: Diagnosis not present

## 2019-09-26 DIAGNOSIS — M9901 Segmental and somatic dysfunction of cervical region: Secondary | ICD-10-CM | POA: Diagnosis not present

## 2019-09-26 DIAGNOSIS — M50322 Other cervical disc degeneration at C5-C6 level: Secondary | ICD-10-CM | POA: Diagnosis not present

## 2019-09-26 DIAGNOSIS — M4312 Spondylolisthesis, cervical region: Secondary | ICD-10-CM | POA: Diagnosis not present

## 2019-09-27 DIAGNOSIS — I2510 Atherosclerotic heart disease of native coronary artery without angina pectoris: Secondary | ICD-10-CM | POA: Diagnosis not present

## 2019-09-27 DIAGNOSIS — I272 Pulmonary hypertension, unspecified: Secondary | ICD-10-CM | POA: Diagnosis not present

## 2019-10-01 DIAGNOSIS — M9901 Segmental and somatic dysfunction of cervical region: Secondary | ICD-10-CM | POA: Diagnosis not present

## 2019-10-01 DIAGNOSIS — M5031 Other cervical disc degeneration,  high cervical region: Secondary | ICD-10-CM | POA: Diagnosis not present

## 2019-10-01 DIAGNOSIS — M50322 Other cervical disc degeneration at C5-C6 level: Secondary | ICD-10-CM | POA: Diagnosis not present

## 2019-10-01 DIAGNOSIS — M4312 Spondylolisthesis, cervical region: Secondary | ICD-10-CM | POA: Diagnosis not present

## 2019-10-01 DIAGNOSIS — M50321 Other cervical disc degeneration at C4-C5 level: Secondary | ICD-10-CM | POA: Diagnosis not present

## 2019-10-03 DIAGNOSIS — M5031 Other cervical disc degeneration,  high cervical region: Secondary | ICD-10-CM | POA: Diagnosis not present

## 2019-10-03 DIAGNOSIS — M50321 Other cervical disc degeneration at C4-C5 level: Secondary | ICD-10-CM | POA: Diagnosis not present

## 2019-10-03 DIAGNOSIS — M4312 Spondylolisthesis, cervical region: Secondary | ICD-10-CM | POA: Diagnosis not present

## 2019-10-03 DIAGNOSIS — M50322 Other cervical disc degeneration at C5-C6 level: Secondary | ICD-10-CM | POA: Diagnosis not present

## 2019-10-03 DIAGNOSIS — M9901 Segmental and somatic dysfunction of cervical region: Secondary | ICD-10-CM | POA: Diagnosis not present

## 2019-10-07 ENCOUNTER — Ambulatory Visit (INDEPENDENT_AMBULATORY_CARE_PROVIDER_SITE_OTHER): Payer: Medicare Other

## 2019-10-07 ENCOUNTER — Encounter: Payer: Self-pay | Admitting: Internal Medicine

## 2019-10-07 ENCOUNTER — Other Ambulatory Visit: Payer: Self-pay

## 2019-10-07 ENCOUNTER — Ambulatory Visit: Payer: Medicare Other | Admitting: Internal Medicine

## 2019-10-07 DIAGNOSIS — R0609 Other forms of dyspnea: Secondary | ICD-10-CM

## 2019-10-07 DIAGNOSIS — R0600 Dyspnea, unspecified: Secondary | ICD-10-CM | POA: Diagnosis not present

## 2019-10-07 DIAGNOSIS — R911 Solitary pulmonary nodule: Secondary | ICD-10-CM | POA: Diagnosis not present

## 2019-10-07 HISTORY — DX: Other forms of dyspnea: R06.09

## 2019-10-07 LAB — BASIC METABOLIC PANEL
BUN: 27 mg/dL — ABNORMAL HIGH (ref 6–23)
CO2: 25 mEq/L (ref 19–32)
Calcium: 9.1 mg/dL (ref 8.4–10.5)
Chloride: 106 mEq/L (ref 96–112)
Creatinine, Ser: 1.5 mg/dL (ref 0.40–1.50)
GFR: 44.82 mL/min — ABNORMAL LOW (ref >60.00)
Glucose, Bld: 123 mg/dL — ABNORMAL HIGH (ref 70–99)
Potassium: 3.7 mEq/L (ref 3.5–5.1)
Sodium: 139 mEq/L (ref 135–145)

## 2019-10-07 LAB — CBC WITH DIFFERENTIAL/PLATELET
Basophils Absolute: 0.1 10*3/uL (ref 0.0–0.1)
Basophils Relative: 1.1 % (ref 0.0–3.0)
Eosinophils Absolute: 0.3 10*3/uL (ref 0.0–0.7)
Eosinophils Relative: 4.1 % (ref 0.0–5.0)
HCT: 37.7 % — ABNORMAL LOW (ref 39.0–52.0)
Hemoglobin: 12.6 g/dL — ABNORMAL LOW (ref 13.0–17.0)
Lymphocytes Relative: 21.6 % (ref 12.0–46.0)
Lymphs Abs: 1.3 10*3/uL (ref 0.7–4.0)
MCHC: 33.3 g/dL (ref 30.0–36.0)
MCV: 93 fl (ref 78.0–100.0)
Monocytes Absolute: 0.5 10*3/uL (ref 0.1–1.0)
Monocytes Relative: 7.5 % (ref 3.0–12.0)
Neutro Abs: 4.1 10*3/uL (ref 1.4–7.7)
Neutrophils Relative %: 65.7 % (ref 43.0–77.0)
Platelets: 211 10*3/uL (ref 150.0–400.0)
RBC: 4.06 Mil/uL — ABNORMAL LOW (ref 4.22–5.81)
RDW: 14 % (ref 11.5–15.5)
WBC: 6.2 10*3/uL (ref 4.0–10.5)

## 2019-10-07 LAB — D-DIMER, QUANTITATIVE: D-Dimer, Quant: <0.19 mcg/mL FEU (ref <0.50)

## 2019-10-07 LAB — TSH: TSH: 1.31 u[IU]/mL (ref 0.35–4.50)

## 2019-10-07 LAB — BRAIN NATRIURETIC PEPTIDE: Pro B Natriuretic peptide (BNP): 42 pg/mL (ref 0.0–100.0)

## 2019-10-07 LAB — SEDIMENTATION RATE: Sed Rate: 5 mm/hr (ref 0–20)

## 2019-10-07 MED ORDER — OMEPRAZOLE 40 MG PO CPDR: DELAYED_RELEASE_CAPSULE | ORAL | 1 refills | Status: DC

## 2019-10-07 NOTE — Assessment & Plan Note (Addendum)
Onset 04/2019  p MI / stent 02/2019 - assoc with cough resolved off acei  - Baseline Spirometry 04/19/2018  FEV1 2.6 (83%)  Ratio 0.77 min concavity  To f/v curve  Echo 09/27/19 The left ventricular size is normal.  There is mild concentric left ventricular hypertrophy.  LV ejection fraction = 60-65%.  The left ventricular wall motion is normal.  The left atrium is mildly dilated.  There is mild mitral regurgitation.  There is moderate mitral annular calcification.  Mild, posterior wall-hugging jet.  Estimated right ventricular systolic pressure is 45 mmHg.  -  10/07/2019   Walked RA  2 laps @ approx 239ft each @ moderate pace  stopped due to end of study, sob but  peak HR only 84 and sats 96% on coreg 25 mg bid   Symptoms are markedly disproportionate to objective findings and not clear to what extent this is actually a pulmonary  problem but pt does appear to have difficult to sort out respiratory symptoms of unknown origin for which  DDX  = almost all start with A and  include Adherence, Ace Inhibitors, Acid Reflux, Active Sinus Disease, Alpha 1 Antitripsin deficiency, Anxiety masquerading as Airways dz,  ABPA,  Allergy(esp in young), Aspiration (esp in elderly), Adverse effects of meds,  Active smoking or Vaping, A bunch of PE's/clot burden (a few small clots can't cause this syndrome unless there is already severe underlying pulm or vascular dz with poor reserve),  Anemia or thyroid disorder, plus two Bs  = Bronchiectasis and Beta blocker use..and one C= CHF     Adherence is always the initial "prime suspect" and is a multilayered concern that requires a "trust but verify" approach in every patient - starting with knowing how to use medications, especially inhalers, correctly, keeping up with refills and understanding the fundamental difference between maintenance and prns vs those medications only taken for a very short course and then stopped and not refilled.  - struggles a bit with  concept of medication reconciliation - rec return with all meds in hand using a trust but verify approach to confirm accurate Medication  Reconciliation The principal here is that until we are certain that the  patients are doing what we've asked, it makes no sense to ask them to do more.   ? Acid (or non-acid) GERD > always difficult to exclude as up to 75% of pts in some series report no assoc GI/ Heartburn symptoms> rec max (24h)  acid suppression and diet restrictions/ reviewed and instructions given in writing.   ? ACEi effects lingering (not onset was while on acei post op  ? Asthma / copd > no variability or noct symptoms to suggest asthma , no copd by prior pfts  ? ? Anxiety decondtioning  > usually at the bottom of this list of usual suspects but  may interfere with adherence and also interpretation of response or lack thereof to symptom management which can be quite subjective > reconditioning ex reviewed   ? Anemia/ thryoid dz > ruled out today   ? Adverse drug effects > none of the usual suspects listed   ?  A bunch of pe's > D dimer nl - while a normal  or high normal value (seen commonly in the elderly or chronically ill)  may miss small peripheral pe, the clot burden with sob is moderately high and the d dimer  has a very high neg pred value if used in this setting.    ?  BB effects > not poor chronotrophic response to exertion ? Needs to be reduced > defer to cards/ may need CPST to sort out   ? CHF > excluded by cxr and such a low BNP but with calcified MV and mild MR at rest one could see how elevated bp / high afterload would adversely affect LAP and he may not have nl chronotropic response to ex due to coreg - the issue or PH was raised by cards but with LA > RA on echo would want to confirm primary Jackson with [facility] before considering any rx other than optimize L sided pressures.    F/u in 4 weeks - call sooner if needed          Each maintenance medication was reviewed in  detail including emphasizing most importantly the difference between maintenance and prns and under what circumstances the prns are to be triggered using an action plan format where appropriate.  Total time for H and P, chart review, counseling,  directly observing portions of ambulatory 02 saturation study/  and generating customized AVS unique to this acute office visit to evaluate new problem / charting = 40 min

## 2019-10-07 NOTE — Patient Instructions (Addendum)
Prilosec(omeprazole)  40 mg Take 30- 60 min before your first and last meals of the day   GERD (REFLUX)  is an extremely common cause of respiratory symptoms just like yours , many times with no obvious heartburn at all.    It can be treated with medication, but also with lifestyle changes including elevation of the head of your bed (ideally with 6 -8inch blocks under the headboard of your bed),  Smoking cessation, avoidance of late meals, excessive alcohol, and avoid fatty foods, chocolate, peppermint, colas, red wine, and acidic juices such as orange juice.  NO MINT OR MENTHOL PRODUCTS SO NO COUGH DROPS  USE SUGARLESS CANDY INSTEAD (Jolley ranchers or Stover's or Life Savers) or even ice chips will also do - the key is to swallow to prevent all throat clearing. NO OIL BASED VITAMINS - use powdered substitutes.  Avoid fish oil when coughing.    Please remember to go to the lab and x-ray department   for your tests - we will call you with the results when they are available.     Please schedule a follow up office visit in 4 weeks, sooner if needed

## 2019-10-07 NOTE — Progress Notes (Signed)
Ian Diaz, male    DOB: [birth year],    MRN: [id number]     Brief patient profile:  4 yowm  Quit smoking 1990  no symptoms at all worked at that point, worked in [employer], clinical and air but no unusual  Exp (mostly [place]) with new symptom of severe reflux to point of sore throat, regurgitation around 2017  new cough indolent onset spring/summer of 2018 worse x late summer 2019 referred to pulmonary clinic 04/19/2018 by Dr   Chancy Milroy for refractory cough  - note changed bp meds to atacand 12/2017 after years of being on another bp med he doesn't know name but cough no better since change. Previous eval by Kozlow reported cough x 2009 but pt says this was misunderstanding and Kozlow rec ent  eval which pt says was negative and not helpful all occurring in the fall of 2019   Prior GI eval 08/2017 egd c/w HH and was dilated empirically      History of Present Illness  04/19/2018  Pulmonary/ 1st office eval/Manas Hickling  Chief Complaint  Patient presents with   Pulmonary Consult    Referred by Dr. Bertram Millard.  Pt c/o cough x 18 months, worse over the past 3-4 months. His cough is prod, esp in the am with yellow sputum.    Dyspnea:  Not limited by breathing from desired activities   Cough:  Worse first thing am and prod of sev tbsp of yellow mucus / no resp to gerd rx /pred/abx or anoro per pt and notes from Dr Chancy Milroy "not ever a smidge, not even for a short period of time (Dr Bruna Potter notes says 90% improved from rx gerd, singulair stop fish oil and excess coffee) Sleep: hob  6 inches or recliner makes no difference  SABA use: none  rec Take your acid suppression but take prilosec 40 mg Take 30- 60 min before your first and last meals of the day  Prednisone 10 mg take  4 each am x 2 days,   2 each am x 2 days,  1 each am x 2 days and stop For drainage / throat tickle   try take CHLORPHENIRAMINE  4 mg (chlortabs at [facility]) - take one every 4 hours as needed   Please remember to go to the lab department    for your tests - we will call you with the results when they are available.    05/10/2018  f/u ov/Ian Diaz re: cough x summer 2018 with neg allergy/ ent eval better since added H1 Chief Complaint  Patient presents with   Follow-up    cough getting better, little cough, mucus gets stuck in throat, in AM light yellow mucus  Dyspnea:  Not limited by breathing from desired activities  /very active Cough: still w/in 15 min of waking Sleeping: in recliner x years may but says "only 4 in" SABA use: no inhalers  rec  schedule sinus CT >nl 05/15/18  For drainage / throat tickle try take CHLORPHENIRAMINE  4 mg - take one every 4 hours as needed - Stop pepcid and nasal spray now and then singulair in a month Please schedule a follow up visit in 3 months but call sooner if needed  with all medications /inhalers/ solutions in hand so we can verify exactly what you are taking. This includes all medications from all doctors and over the counters     08/22/2018  f/u ov/Ian Diaz re: cough improved  "99%" off singulair  Chief Complaint  Patient presents with   Follow-up    Cough is "basically gone". No new co's.   Dyspnea:  Not limited by breathing from desired activities   Cough: much better p rx 1st gen H1 blockers per guidelines using about 3 or 4 per day plus xyzal each am but finds the 1st gen working better   Sleeping: fine / some am throat congestion/ nasal drainage  SABA use: inhalers   rec Stop levocetrazine and continue chlorpheniramine 4 mg 2 at bedtime and as needed during the day   11/20/2018  f/u ov/Ian Diaz re: uacs  Chief Complaint  Patient presents with   Follow-up    Still has some throat clearing.  Dyspnea:  Not limited by breathing from desired activities   Cough: variably/assoc with throat clearing better on when takes h1 but uses only a few in daytime / does not make him sleepy  Sleeping: still in recliner and no change flat when he does lie down now does better SABA use: none 02:  none  rec Ok to adjust your chlortab during the day to see if it helps your drainage, cough, congestion      10/07/2019  f/u ov/Ian Diaz re: new onset doe since Jan 2021   Chief Complaint  Patient presents with   Acute Visit    DOE x Jan 2021. Started after having MI. He states he gets winded after working outside about 20 min.  Dyspnea:  Uphill from barn was good in Dec which as  p mi in November but by Jan 2021 stop half way back and seems to be getting worse  Cough: better off acei  Sleeping: 15 degrees recliner  SABA use none 02: none    No obvious day to day or daytime variability or assoc excess/ purulent sputum or mucus plugs or hemoptysis or cp or chest tightness, subjective wheeze or overt sinus or hb symptoms.   Sleeping  without nocturnal  or early am exacerbation  of respiratory  c/o's or need for noct saba. Also denies any obvious fluctuation of symptoms with weather or environmental changes or other aggravating or alleviating factors except as outlined above   No unusual exposure hx or h/o childhood pna/ asthma or knowledge of premature birth.  Current Allergies, Complete Past Medical History, Past Surgical History, Family History, and Social History were reviewed in [facility] record.  ROS  The following are not active complaints unless bolded Hoarseness, sore throat, dysphagia, dental problems, itching, sneezing,  nasal congestion or discharge of excess mucus or purulent secretions, ear ache,   fever, chills, sweats, unintended wt loss or wt gain, classically pleuritic or exertional cp,  orthopnea pnd or arm/hand swelling  or leg swelling, presyncope, palpitations, abdominal pain, anorexia, nausea, vomiting, diarrhea  or change in bowel habits or change in bladder habits, change in stools or change in urine, dysuria, hematuria,  rash, arthralgias, visual complaints, headache, numbness, weakness or ataxia or problems with walking or coordination,  change  in mood or  memory.        Current Meds  Medication Sig   amLODipine (NORVASC) 5 MG tablet Take 5 mg by mouth daily.   aspirin 81 MG chewable tablet Chew 1 tablet by mouth daily.        atorvastatin (LIPITOR) 80 MG tablet Take 1 tablet by mouth daily.   candesartan-hydrochlorothiazide (ATACAND HCT) 16-12.5 MG tablet Take 1 tablet by mouth daily.   carvedilol (COREG) 25 MG tablet Take 1 tablet by mouth  in the morning and at bedtime.   Ferrous Sulfate (IRON) 325 (65 Fe) MG TABS Take 1 tablet by mouth daily.   hydrochlorothiazide (HYDRODIURIL) 12.5 MG tablet Take 1 tablet by mouth daily.   Multiple Vitamins-Minerals (CENTRUM SILVER) tablet Take 1 tablet by mouth daily.   nitroGLYCERIN (NITROSTAT) 0.4 MG SL tablet Place under the tongue. As directed when needed   omeprazole (PRILOSEC) 40 MG capsule TAKE ONE CAPSULE BY MOUTH  TWICE DAILY AS DIRECTED.   ticagrelor (BRILINTA) 90 MG TABS tablet Take 1 tablet by mouth in the morning and at bedtime.                   Objective:     10/07/2019       225  11/20/2018        235   08/22/2018        236   05/10/18 231 lb (104.8 kg)  04/19/18 236 lb 6.4 oz (107.2 kg)  02/21/18 236 lb (107 kg)     somber mod obese wm nad  Vital signs reviewed  10/07/2019  - Note at rest 02 sats  98% on RA     HEENT : pt wearing mask not removed for exam due to covid -19 concerns.    NECK :  without JVD/Nodes/TM/ nl carotid upstrokes bilaterally   LUNGS: no acc muscle use,  Nl contour chest which is clear to A and P bilaterally without cough on insp or exp maneuvers   CV:  RRR  no s3 or murmur or increase in P2, and trace to 1+ pitting both LE's   ABD:  soft and nontender with nl inspiratory excursion in the supine position. No bruits or organomegaly appreciated, bowel sounds nl  MS:  Nl gait/ ext warm without deformities, calf tenderness, cyanosis or clubbing No obvious joint restrictions   SKIN: warm and dry without lesions    NEURO:   alert, approp, nl sensorium with  no motor or cerebellar deficits apparent.      CXR PA and Lateral:   10/07/2019 :    I personally reviewed images and  impression as follows:   wnl    Labs ordered/ reviewed:      Chemistry      Component Value Date/Time   NA 139 10/07/2019 1450   K 3.7 10/07/2019 1450   CL 106 10/07/2019 1450   CO2 25 10/07/2019 1450   BUN 27 (H) 10/07/2019 1450   CREATININE 1.50 10/07/2019 1450      Component Value Date/Time   CALCIUM 9.1 10/07/2019 1450        Lab Results  Component Value Date   WBC 6.2 10/07/2019   HGB 12.6 (L) 10/07/2019   HCT 37.7 (L) 10/07/2019   MCV 93.0 10/07/2019   PLT 211.0 10/07/2019       EOS                                                               0.3                                    10/07/2019   Lab Results  Component Value Date   DDIMER <0.19  10/07/2019      Lab Results  Component Value Date   TSH 1.31 10/07/2019     Lab Results  Component Value Date   PROBNP 42.0 10/07/2019       Lab Results  Component Value Date   ESRSEDRATE 5 10/07/2019                    Assessment

## 2019-10-08 DIAGNOSIS — M5031 Other cervical disc degeneration,  high cervical region: Secondary | ICD-10-CM | POA: Diagnosis not present

## 2019-10-08 DIAGNOSIS — M50321 Other cervical disc degeneration at C4-C5 level: Secondary | ICD-10-CM | POA: Diagnosis not present

## 2019-10-08 DIAGNOSIS — M9901 Segmental and somatic dysfunction of cervical region: Secondary | ICD-10-CM | POA: Diagnosis not present

## 2019-10-08 DIAGNOSIS — M50322 Other cervical disc degeneration at C5-C6 level: Secondary | ICD-10-CM | POA: Diagnosis not present

## 2019-10-08 DIAGNOSIS — M4312 Spondylolisthesis, cervical region: Secondary | ICD-10-CM | POA: Diagnosis not present

## 2019-11-25 DIAGNOSIS — D509 Iron deficiency anemia, unspecified: Secondary | ICD-10-CM | POA: Diagnosis not present

## 2019-11-25 DIAGNOSIS — I10 Essential (primary) hypertension: Secondary | ICD-10-CM | POA: Diagnosis not present

## 2019-11-25 DIAGNOSIS — I2510 Atherosclerotic heart disease of native coronary artery without angina pectoris: Secondary | ICD-10-CM | POA: Diagnosis not present

## 2019-11-25 DIAGNOSIS — E7800 Pure hypercholesterolemia, unspecified: Secondary | ICD-10-CM | POA: Diagnosis not present

## 2019-11-25 DIAGNOSIS — Z0000 Encounter for general adult medical examination without abnormal findings: Secondary | ICD-10-CM | POA: Diagnosis not present

## 2019-11-25 DIAGNOSIS — Z79899 Other long term (current) drug therapy: Secondary | ICD-10-CM | POA: Diagnosis not present

## 2019-12-03 ENCOUNTER — Ambulatory Visit (INDEPENDENT_AMBULATORY_CARE_PROVIDER_SITE_OTHER): Payer: Medicare Other

## 2019-12-03 ENCOUNTER — Encounter: Payer: Self-pay | Admitting: Internal Medicine

## 2019-12-03 ENCOUNTER — Ambulatory Visit: Payer: Medicare Other | Admitting: Internal Medicine

## 2019-12-03 ENCOUNTER — Other Ambulatory Visit: Payer: Self-pay

## 2019-12-03 DIAGNOSIS — R0600 Dyspnea, unspecified: Secondary | ICD-10-CM | POA: Diagnosis not present

## 2019-12-03 DIAGNOSIS — R0609 Other forms of dyspnea: Secondary | ICD-10-CM

## 2019-12-03 DIAGNOSIS — R05 Cough: Secondary | ICD-10-CM

## 2019-12-03 DIAGNOSIS — R911 Solitary pulmonary nodule: Secondary | ICD-10-CM | POA: Diagnosis not present

## 2019-12-03 DIAGNOSIS — R058 Other specified cough: Secondary | ICD-10-CM

## 2019-12-03 NOTE — Progress Notes (Signed)
Ian Diaz, male    DOB: 17-Feb-1936,    MRN: [id number]     Brief patient profile:  24 yowm  Quit smoking 1990  no symptoms at all worked at that point, worked in [employer], clinical and air but no unusual  Exp (mostly [place]) with new symptom of severe reflux to point of sore throat, regurgitation around 2017  new cough indolent onset spring/summer of 2018 worse x late summer 2019 referred to pulmonary clinic 04/19/2018 by Dr   Chancy Milroy for refractory cough  - note changed bp meds to atacand 12/2017 after years of being on another bp med he doesn't know name but cough no better since change. Previous eval by Kozlow reported cough x 2009 but pt says this was misunderstanding and Kozlow rec ent  eval which pt says was negative and not helpful all occurring in the fall of 2019   Prior GI eval 08/2017 egd c/w HH and was dilated empirically      History of Present Illness  04/19/2018  Pulmonary/ 1st office eval/Glendia Olshefski  Chief Complaint  Patient presents with  . Pulmonary Consult    Referred by Dr. Bertram Millard.  Pt c/o cough x 18 months, worse over the past 3-4 months. His cough is prod, esp in the am with yellow sputum.    Dyspnea:  Not limited by breathing from desired activities   Cough:  Worse first thing am and prod of sev tbsp of yellow mucus / no resp to gerd rx /pred/abx or anoro per pt and notes from Dr Chancy Milroy "not ever a smidge, not even for a short period of time (Dr Bruna Potter notes says 90% improved from rx gerd, singulair stop fish oil and excess coffee) Sleep: hob  6 inches or recliner makes no difference  SABA use: none  rec Take your acid suppression but take prilosec 40 mg Take 30- 60 min before your first and last meals of the day  Prednisone 10 mg take  4 each am x 2 days,   2 each am x 2 days,  1 each am x 2 days and stop For drainage / throat tickle   try take CHLORPHENIRAMINE  4 mg ([facility]abs at [facility]) - take one every 4 hours as needed   Please remember to go to the lab department    for your tests - we will call you with the results when they are available.    05/10/2018  f/u ov/Kenderick Kobler re: cough x summer 2018 with neg allergy/ ent eval better since added H1 Chief Complaint  Patient presents with  . Follow-up    cough getting better, little cough, mucus gets stuck in throat, in AM light yellow mucus  Dyspnea:  Not limited by breathing from desired activities  /very active Cough: still w/in 15 min of waking Sleeping: in recliner x years may but says "only 4 in" SABA use: no inhalers  rec  schedule sinus CT >nl 05/15/18  For drainage / throat tickle try take CHLORPHENIRAMINE  4 mg - take one every 4 hours as needed - Stop pepcid and nasal spray now and then singulair in a month Please schedule a follow up visit in 3 months but call sooner if needed  with all medications /inhalers/ solutions in hand so we can verify exactly what you are taking. This includes all medications from all doctors and over the counters     08/22/2018  f/u ov/Omer Monter re: cough improved  "99%" off singulair  Chief Complaint  Patient presents with  . Follow-up    Cough is "basically gone". No new co's.   Dyspnea:  Not limited by breathing from desired activities   Cough: much better p rx 1st gen H1 blockers per guidelines using about 3 or 4 per day plus xyzal each am but finds the 1st gen working better   Sleeping: fine / some am throat congestion/ nasal drainage  SABA use: inhalers   rec Stop levocetrazine and continue chlorpheniramine 4 mg 2 at bedtime and as needed during the day   11/20/2018  f/u ov/Pratt Bress re: uacs  Chief Complaint  Patient presents with  . Follow-up    Still has some throat clearing.  Dyspnea:  Not limited by breathing from desired activities   Cough: variably/assoc with throat clearing better on when takes h1 but uses only a few in daytime / does not make him sleepy  Sleeping: still in recliner and no change flat when he does lie down now does better SABA use: none 02:  none  rec Ok to adjust your chlortab during the day to see if it helps your drainage, cough, congestion      10/07/2019  f/u ov/Amiera Herzberg re: new onset doe since Jan 2021   Chief Complaint  Patient presents with  . Acute Visit    DOE x Jan 2021. Started after having MI. He states he gets winded after working outside about 20 min.  Dyspnea:  [place]ill from barn was good in Dec 2020  which was  p mi in November but by Jan 2021 stop half way back and seems to be getting worse  Cough: better off acei  Sleeping: 15 degrees recliner  SABA use none 02: none  rec Prilosec(omeprazole)  40 mg Take 30- 60 min before your first and last meals of the day  GERD diet  Xray ok x ? R nipple marker.   12/03/2019  f/u ov/Cloe Sockwell re: improved doe  Chief Complaint  Patient presents with  . Follow-up    pt states no compliants   Dyspnea:  Walks to barn and uphill back s stopping which is better  Cough: very little /sometimes p eat Sleeping: can sleep flat now/ but thick pillow SABA use: none  02: none    No obvious day to day or daytime variability or assoc excess/ purulent sputum or mucus plugs or hemoptysis or cp or chest tightness, subjective wheeze or overt sinus or hb symptoms.   sleeping without nocturnal  or early am exacerbation  of respiratory  c/o's or need for noct saba. Also denies any obvious fluctuation of symptoms with weather or environmental changes or other aggravating or alleviating factors except as outlined above   No unusual exposure hx or h/o childhood pna/ asthma or knowledge of premature birth.  Current Allergies, Complete Past Medical History, Past Surgical History, Family History, and Social History were reviewed in [employer] [facility] record.  ROS  The following are not active complaints unless bolded Hoarseness, sore throat, dysphagia, dental problems, itching, sneezing,  nasal congestion or discharge of excess mucus or purulent secretions, ear ache,   fever,  chills, sweats, unintended wt loss or wt gain, classically pleuritic or exertional cp,  orthopnea pnd or arm/hand swelling  or leg swelling, presyncope, palpitations, abdominal pain, anorexia, nausea, vomiting, diarrhea  or change in bowel habits or change in bladder habits, change in stools or change in urine, dysuria, hematuria,  rash, arthralgias, visual complaints, headache, numbness, weakness or ataxia or problems  with walking or coordination,  change in mood or  memory.        Current Meds  Medication Sig  . amLODipine (NORVASC) 5 MG tablet Take 5 mg by mouth daily.  Marland Kitchen aspirin 81 MG chewable tablet Chew 1 tablet by mouth daily.  Marland Kitchen atorvastatin (LIPITOR) 80 MG tablet Take 1 tablet by mouth daily.  . candesartan-hydrochlorothiazide (ATACAND HCT) 16-12.5 MG tablet Take 1 tablet by mouth daily.  . carvedilol (COREG) 25 MG tablet Take 1 tablet by mouth in the morning and at bedtime.  . Ferrous Sulfate (IRON) 325 (65 Fe) MG TABS Take 2 tablets by mouth 2 (two) times daily.   . Multiple Vitamins-Minerals (CENTRUM SILVER) tablet Take 1 tablet by mouth daily.  . nitroGLYCERIN (NITROSTAT) 0.4 MG SL tablet Place under the tongue. As directed when needed  . omeprazole (PRILOSEC) 40 MG capsule Take 30- 60 min before your first and last meals of the day  . ticagrelor (BRILINTA) 90 MG TABS tablet Take 1 tablet by mouth in the morning and at bedtime.                     Objective:     12/03/2019       223  10/07/2019       225  11/20/2018        235   08/22/2018        236   05/10/18 231 lb (104.8 kg)  04/19/18 236 lb 6.4 oz (107.2 kg)  02/21/18 236 lb (107 kg)    amb pleasant white haired male nad  Vital signs reviewed  12/03/2019  - Note at rest 02 sats  98% on RA     trace to 1+ pitting both LE's R slt > L    HEENT : pt wearing mask not removed for exam due to covid -19 concerns.    NECK :  without JVD/Nodes/TM/ nl carotid upstrokes bilaterally   LUNGS: no acc muscle use,  Nl contour  chest which is clear to A and P bilaterally without cough on insp or exp maneuvers   CV:  RRR  no s3 or murmur or increase in P2, and  1+ pitting LE R slt > L   ABD:  soft and nontender with nl inspiratory excursion in the supine position. No bruits or organomegaly appreciated, bowel sounds nl  MS:  Nl gait/ ext warm without deformities, calf tenderness, cyanosis or clubbing No obvious joint restrictions   SKIN: warm and dry without lesions    NEURO:  alert, approp, nl sensorium with  no motor or cerebellar deficits apparent.          CXR PA and Lateral:   12/03/2019 :    I personally reviewed images and agree with radiology impression as follows:   Chest x-ray with nipple markers reveals the previously identified nodule over the right lung base to be a nipple shadow. Left nipple shadow also noted.      Assessment

## 2019-12-03 NOTE — Progress Notes (Signed)
Called and spoke with patient about xray result per Dr Melvyn Novas. All questions answered and patient expressed understanding. Nothing further needed at this time.

## 2019-12-03 NOTE — Patient Instructions (Signed)
Please remember to go to the  x-ray department  for your tests - we will call you with the results when they are available     Please schedule a follow up visit in 3 months but call sooner if needed  

## 2019-12-04 ENCOUNTER — Encounter: Payer: Self-pay | Admitting: Internal Medicine

## 2019-12-04 NOTE — Assessment & Plan Note (Signed)
Chest CT 02/28/18 1) enchondroma R lat 9th rib stable since 2009  2) 9 mm RLL well circ rec 6-12 m f/u > CT 03/05/2019  slt decrease > no directed f/u needed   cxr with nipple markers showed prev lesion on cxr was nipple > no f/u needed           Each maintenance medication was reviewed in detail including emphasizing most importantly the difference between maintenance and prns and under what circumstances the prns are to be triggered using an action plan format where appropriate.  Total time for H and P, chart review, counseling,   and generating customized AVS unique to this office visit / charting = 15 min

## 2019-12-04 NOTE — Assessment & Plan Note (Signed)
Onset was summer 2018    Prior GI eval 08/2017 egd c/w HH and was dilated empirically  - Spirometry 04/19/2018  FEV1 2.6 (83%)  Ratio 73 s curvature  s prior rx  - FENO 04/19/2018  =   30  - Allergy profile 04/19/2018 >  Eos 0.4 /  IgE  206 RAST neg  - Dg Es 04/26/2018 1. Small type 1 hiatal hernia. 2. Mild smooth narrowing of the distal esophagus, without irregularity, maximum distended diameter bout 14 mm. A 13 mm barium tablet did pass through this area without difficulty. Although there is no irregularity to suggest malignancy or ulcer, this smooth narrowing could potentially cause symptoms of food sticking. 3. Vallecular and piriform pooling after swallows, otherwise unremarkable pharyngeal phase of swallowing.  - 05/10/18 d/c pepcid, nasal sprays, singulair  - Sinus CT 05/15/2018  Clear paranasal sinuses.  Leftward septal deviation - 08/22/2018 reported marked improvement on xyzal plus chlorpheniramine rec d/c xyzal   Improved with minimal residual globus sensation > no change rx

## 2019-12-04 NOTE — Assessment & Plan Note (Signed)
Onset 04/2019  p MI / stent 02/2019 - assoc with cough resolved off acei  - Baseline Spirometry 04/19/2018  FEV1 2.6 (83%)  Ratio 0.77 min concavity  To f/v curve  Echo 09/27/19 The left ventricular size is normal.  There is mild concentric left ventricular hypertrophy.  LV ejection fraction = 60-65%.  The left ventricular wall motion is normal.  The left atrium is mildly dilated.  There is mild mitral regurgitation.  There is moderate mitral annular calcification.  Mild, posterior wall-hugging jet.  Estimated right ventricular systolic pressure is 45 mmHg.  -  10/07/2019   Walked RA  2 laps @ approx 211ft each @ moderate pace  stopped due to end of study, sob but  peak HR only 84 and sats 96% on coreg 25 mg bid   Improved to his satisfaction

## 2020-02-06 DIAGNOSIS — Z23 Encounter for immunization: Secondary | ICD-10-CM | POA: Diagnosis not present

## 2020-02-27 DIAGNOSIS — H2513 Age-related nuclear cataract, bilateral: Secondary | ICD-10-CM | POA: Diagnosis not present

## 2020-03-04 ENCOUNTER — Ambulatory Visit: Payer: Medicare Other | Admitting: Internal Medicine

## 2020-03-10 ENCOUNTER — Encounter: Payer: Self-pay | Admitting: Internal Medicine

## 2020-03-10 ENCOUNTER — Other Ambulatory Visit: Payer: Self-pay

## 2020-03-10 ENCOUNTER — Ambulatory Visit (INDEPENDENT_AMBULATORY_CARE_PROVIDER_SITE_OTHER): Payer: Medicare Other

## 2020-03-10 ENCOUNTER — Ambulatory Visit: Payer: Medicare Other | Admitting: Internal Medicine

## 2020-03-10 DIAGNOSIS — R0600 Dyspnea, unspecified: Secondary | ICD-10-CM | POA: Diagnosis not present

## 2020-03-10 DIAGNOSIS — R058 Other specified cough: Secondary | ICD-10-CM | POA: Diagnosis not present

## 2020-03-10 DIAGNOSIS — J90 Pleural effusion, not elsewhere classified: Secondary | ICD-10-CM | POA: Diagnosis not present

## 2020-03-10 DIAGNOSIS — R0609 Other forms of dyspnea: Secondary | ICD-10-CM

## 2020-03-10 DIAGNOSIS — R0602 Shortness of breath: Secondary | ICD-10-CM | POA: Diagnosis not present

## 2020-03-10 NOTE — Patient Instructions (Addendum)
No change in medications    Please remember to go to the  x-ray department  for your tests - we will call you with the results when they are available    Make sure you check your oxygen saturations at highest level of activity to be sure it stays over 90% and adjust  02 flow upward to maintain this level if needed but remember to turn it back to previous settings when you stop (to conserve your supply).    Please schedule a follow up visit in 6  months but call sooner if needed

## 2020-03-10 NOTE — Progress Notes (Signed)
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 Previous eval by Kozlow reported cough x 2009 but pt says this was misunderstanding and Kozlow rec ent  eval which pt says was negative and not helpful all occurring in the fall of 2019   Prior GI eval 08/2017 egd c/w HH and was dilated empirically      History of Present Illness  04/19/2018  Pulmonary/ 1st office eval/Itzia Cunliffe  Chief Complaint  Patient presents with  . Pulmonary Consult    Referred by Dr. Bertram Millard.  Pt c/o cough x 18 months, worse over the past 3-4 months. His cough is prod, esp in the am with yellow sputum.    Dyspnea:  Not limited by breathing from desired activities   Cough:  Worse first thing am and prod of sev tbsp of yellow mucus / no resp to gerd rx /pred/abx or anoro per pt and notes from Dr Chancy Milroy "not ever a smidge, not even for a short period of time (Dr Bruna Potter notes says 90% improved from rx gerd, singulair stop fish oil and excess coffee) Sleep: hob  6 inches or recliner makes no difference  SABA use: none  rec Take your acid suppression but take prilosec 40 mg Take 30- 60 min before your first and last meals of the day  Prednisone 10 mg take  4 each am x 2 days,   2 each am x 2 days,  1 each am x 2 days and stop For drainage / throat tickle   try take CHLORPHENIRAMINE  4 mg ([facility]lortabs at [facility]) - take one every 4 hours as needed   Please remember to go to the lab department    for your tests - we will call you with the results when they are available.    05/10/2018  f/u ov/Gor Vestal re: cough x summer 2018 with neg allergy/ ent eval better since added H1 Chief Complaint  Patient presents with  . Follow-up    cough getting better, little cough, mucus gets stuck in throat, in AM light yellow mucus  Dyspnea:  Not limited by breathing from desired activities  /very active Cough: still w/in 15 min of waking Sleeping: in recliner x years may but says "only 4 in" SABA use: no inhalers  rec  schedule sinus CT >nl 05/15/18  For drainage / throat tickle try take CHLORPHENIRAMINE  4 mg - take one every 4 hours as needed - Stop pepcid and nasal spray now and then singulair in a month Please schedule a follow up visit in 3 months but call sooner if needed  with all medications /inhalers/ solutions in hand so we can verify exactly what you are taking. This includes all medications from all doctors and over the counters     08/22/2018  f/u ov/Dewane Timson re: cough improved  "99%" off singulair  Chief Complaint  Patient presents with  . Follow-up    Cough is "basically gone". No new co's.   Dyspnea:  Not limited by breathing from desired activities   Cough: much better p rx 1st gen H1 blockers per guidelines using about 3 or 4 per day plus xyzal each am but finds the 1st gen working better   Sleeping: fine / some am throat congestion/ nasal drainage  SABA use: inhalers   rec Stop levocetrazine and continue chlorpheniramine 4 mg 2 at bedtime and as needed during the day   11/20/2018  f/u ov/Davyd Podgorski re: uacs  Chief Complaint  Patient presents with  . Follow-up    Still has some throat clearing.  Dyspnea:  Not limited by breathing from desired activities   Cough: variably/assoc with throat clearing better on when takes h1 but uses only a few in daytime / does not make him sleepy  Sleeping: still in recliner and no change flat when he does lie down now does better SABA use: none 02:  none  rec Ok to adjust your chlortab during the day to see if it helps your drainage, cough, congestion      10/07/2019  f/u ov/Christipher Rieger re:  UACS  new onset doe since Jan 2021  / improves with H1 rx Chief Complaint  Patient presents with  . Acute Visit    DOE x Jan 2021. Started after having MI. He states he gets winded after working outside about 20 min.  Dyspnea:  Uphill from barn was good in Dec 2020  which was  p mi in November but by Jan 2021 stop half way back and seems to be getting worse  Cough: better off acei  Sleeping: 15 degrees recliner  SABA use none 02: none  rec Prilosec(omeprazole)  40 mg Take 30- 60 min before your first and last meals of the day  GERD diet      12/03/2019  f/u ov/Aydenn Gervin re: improved doe  Chief Complaint  Patient presents with  . Follow-up    pt states no compliants   Dyspnea:  Walks to barn and uphill back s stopping which is better  Cough: very little /sometimes p eat Sleeping: can sleep flat now/ but thick pillow SABA use: none  02: none  rec No change rx  covid 19  Sept 2021    03/10/2020  f/u ov/Icis Budreau re: doe since Jan 2021  Chief Complaint  Patient presents with  . Follow-up    Breathing seems worse for the past 1-2 wks. He gets winded walking up hill. He states had tested pos for covid 19 beginning of Sept 2021- had monoclonal antibody infusion.   Dyspnea: more doe to barn since cold weather Cough: just in ams x first hour light yellow x years   h1 seems to help some Sleeping: flat bed, couple pillows due to neck  SABA use: none 02: none   No obvious day to day or daytime variability or assoc  mucus plugs or hemoptysis or cp or chest tightness, subjective wheeze or overt sinus or hb symptoms.   Sleeping as above without nocturnal  or early am exacerbation  of respiratory  c/o's or need for noct saba. Also denies any obvious fluctuation of symptoms with weather or environmental changes or other aggravating or alleviating factors  except as outlined above   No unusual exposure hx or h/o childhood pna/ asthma or knowledge of premature birth.  Current Allergies, Complete Past Medical History, Past Surgical History, Family History, and  Social History were reviewed in [employer] record.  ROS  The following are not active complaints unless bolded Hoarseness, sore throat, dysphagia x years, just pills, dental problems, itching, sneezing,  nasal congestion or discharge of excess mucus or purulent secretions, ear ache,   fever, chills, sweats, unintended wt loss or wt gain, classically pleuritic or exertional cp,  orthopnea pnd or arm/hand swelling  or leg swelling, presyncope, palpitations, abdominal pain, anorexia, nausea, vomiting, diarrhea  or change in bowel habits or change in bladder habits, change in stools or change in urine, dysuria, hematuria,  rash, arthralgias, visual complaints, headache, numbness, weakness or ataxia or problems with walking or coordination,  change in mood or  memory.        Current Meds  Medication Sig  . amLODipine (NORVASC) 5 MG tablet Take 5 mg by mouth daily.  Marland Kitchen aspirin EC 81 MG tablet Take 81 mg by mouth daily. Swallow whole.  Marland Kitchen atorvastatin (LIPITOR) 80 MG tablet Take 1 tablet by mouth daily.  . candesartan-hydrochlorothiazide (ATACAND HCT) 16-12.5 MG tablet Take 1 tablet by mouth daily.  . carvedilol (COREG) 25 MG tablet Take 1 tablet by mouth in the morning and at bedtime.  . Ferrous Sulfate (IRON) 325 (65 Fe) MG TABS Take 2 tablets by mouth 2 (two) times daily.   . Multiple Vitamins-Minerals (CENTRUM SILVER) tablet Take 1 tablet by mouth daily.  . nitroGLYCERIN (NITROSTAT) 0.4 MG SL tablet Place under the tongue. As directed when needed  . omeprazole (PRILOSEC) 40 MG capsule Take 30- 60 min before your first and last meals of the day  . ticagrelor (BRILINTA) 90 MG TABS tablet Take 1 tablet by mouth in the morning and at bedtime.                      Objective:    03/10/2020      231 12/03/2019       223  10/07/2019       225  11/20/2018        235   08/22/2018        236   05/10/18 231 lb (104.8 kg)  04/19/18 236 lb 6.4 oz (107.2 kg)  02/21/18 236 lb (107 kg)      Vital signs reviewed  03/10/2020  - Note at rest 02 sats  99% on RA    Trace pitting both le's    HEENT : pt wearing mask not removed for exam due to covid -19 concerns.    NECK :  without JVD/Nodes/TM/ nl carotid upstrokes bilaterally   LUNGS: no acc muscle use,  Nl contour chest which is clear to A and P bilaterally without cough on insp or exp maneuvers   CV:  RRR  no s3 or murmur or increase in P2, and trace edema sym both LEs  'ABD:  soft and nontender with nl inspiratory excursion in the supine position. No bruits or organomegaly appreciated, bowel sounds nl  MS:  Nl gait/ ext warm without deformities, calf tenderness, cyanosis or clubbing No obvious joint restrictions   SKIN: warm and dry without lesions    NEURO:  alert, approp, nl sensorium with  no motor or cerebellar deficits apparent.       CXR PA and Lateral:   03/10/2020 :    I personally reviewed images and agree with radiology impression as follows:    No acute cardiopulmonary disease.        Assessment

## 2020-03-11 ENCOUNTER — Encounter: Payer: Self-pay | Admitting: Internal Medicine

## 2020-03-11 NOTE — Assessment & Plan Note (Addendum)
Onset 04/2019  p MI / stent 02/2019 - assoc with cough resolved off acei  - Baseline Spirometry 04/19/2018  FEV1 2.6 (83%)  Ratio 0.77 min concavity  To f/v curve  Echo 09/27/19 The left ventricular size is normal.  There is mild concentric left ventricular hypertrophy.  LV ejection fraction = 60-65%.  The left ventricular wall motion is normal.  The left atrium is mildly dilated.  There is mild mitral regurgitation.  There is moderate mitral annular calcification.  Mild, posterior wall-hugging jet.  Estimated right ventricular systolic pressure is 45 mmHg.  -  10/07/2019   Walked RA  2 laps @ approx 267ft each @ moderate pace  stopped due to end of study, sob but  peak HR only 84 and sats 96% on coreg 25 mg bid   No def evidence of a pulmonary limitation here.  Advised: Make sure you check your oxygen saturations at highest level of activity to be sure it stays over 90% and keep track of it at least once a week, more often if breathing getting worse, and let me know if losing ground.

## 2020-03-11 NOTE — Assessment & Plan Note (Signed)
Onset was summer 2018    Prior GI eval 08/2017 egd c/w HH and was dilated empirically  - Spirometry 04/19/2018  FEV1 2.6 (83%)  Ratio 73 s curvature  s prior rx  - FENO 04/19/2018  =   30  - Allergy profile 04/19/2018 >  Eos 0.4 /  IgE  206 RAST neg  - Dg Es 04/26/2018 1. Small type 1 hiatal hernia. 2. Mild smooth narrowing of the distal esophagus, without irregularity, maximum distended diameter bout 14 mm. A 13 mm barium tablet did pass through this area without difficulty. Although there is no irregularity to suggest malignancy or ulcer, this smooth narrowing could potentially cause symptoms of food sticking. 3. Vallecular and piriform pooling after swallows, otherwise unremarkable pharyngeal phase of swallowing.  - 05/10/18 d/c pepcid, nasal sprays, singulair  - Sinus CT 05/15/2018  Clear paranasal sinuses.  Leftward septal deviation - 08/22/2018 reported marked improvement on xyzal plus chlorpheniramine rec d/c xyzal   Improved on 1st gen H1 blockers per guidelines    F/u in 6 m, sooner prn         Each maintenance medication was reviewed in detail including emphasizing most importantly the difference between maintenance and prns and under what circumstances the prns are to be triggered using an action plan format where appropriate.  Total time for H and P, chart review, counseling, teaching device and generating customized AVS unique to this office visit / charting = 15 min

## 2020-03-17 DIAGNOSIS — H2512 Age-related nuclear cataract, left eye: Secondary | ICD-10-CM | POA: Diagnosis not present

## 2020-03-18 DIAGNOSIS — H25812 Combined forms of age-related cataract, left eye: Secondary | ICD-10-CM | POA: Diagnosis not present

## 2020-03-23 DIAGNOSIS — Z23 Encounter for immunization: Secondary | ICD-10-CM | POA: Diagnosis not present

## 2020-03-31 DIAGNOSIS — Z01818 Encounter for other preprocedural examination: Secondary | ICD-10-CM | POA: Diagnosis not present

## 2020-03-31 DIAGNOSIS — H2511 Age-related nuclear cataract, right eye: Secondary | ICD-10-CM | POA: Diagnosis not present

## 2020-04-01 DIAGNOSIS — H25811 Combined forms of age-related cataract, right eye: Secondary | ICD-10-CM | POA: Diagnosis not present

## 2020-04-21 DIAGNOSIS — H43821 Vitreomacular adhesion, right eye: Secondary | ICD-10-CM | POA: Diagnosis not present

## 2020-04-23 DIAGNOSIS — I2510 Atherosclerotic heart disease of native coronary artery without angina pectoris: Secondary | ICD-10-CM | POA: Diagnosis not present

## 2020-04-23 DIAGNOSIS — Z87891 Personal history of nicotine dependence: Secondary | ICD-10-CM | POA: Diagnosis not present

## 2020-04-23 DIAGNOSIS — D509 Iron deficiency anemia, unspecified: Secondary | ICD-10-CM | POA: Diagnosis not present

## 2020-04-23 DIAGNOSIS — Z7982 Long term (current) use of aspirin: Secondary | ICD-10-CM | POA: Diagnosis not present

## 2020-04-23 DIAGNOSIS — I272 Pulmonary hypertension, unspecified: Secondary | ICD-10-CM | POA: Diagnosis not present

## 2020-04-23 DIAGNOSIS — E782 Mixed hyperlipidemia: Secondary | ICD-10-CM | POA: Diagnosis not present

## 2020-04-23 DIAGNOSIS — I10 Essential (primary) hypertension: Secondary | ICD-10-CM | POA: Diagnosis not present

## 2020-05-17 DIAGNOSIS — E7800 Pure hypercholesterolemia, unspecified: Secondary | ICD-10-CM | POA: Diagnosis not present

## 2020-05-17 DIAGNOSIS — I10 Essential (primary) hypertension: Secondary | ICD-10-CM | POA: Diagnosis not present

## 2020-05-17 DIAGNOSIS — I2510 Atherosclerotic heart disease of native coronary artery without angina pectoris: Secondary | ICD-10-CM | POA: Diagnosis not present

## 2020-06-15 DIAGNOSIS — E7800 Pure hypercholesterolemia, unspecified: Secondary | ICD-10-CM | POA: Diagnosis not present

## 2020-06-15 DIAGNOSIS — I2510 Atherosclerotic heart disease of native coronary artery without angina pectoris: Secondary | ICD-10-CM | POA: Diagnosis not present

## 2020-06-15 DIAGNOSIS — I10 Essential (primary) hypertension: Secondary | ICD-10-CM | POA: Diagnosis not present

## 2020-08-31 DIAGNOSIS — Z23 Encounter for immunization: Secondary | ICD-10-CM | POA: Diagnosis not present

## 2020-09-07 ENCOUNTER — Encounter: Payer: Self-pay | Admitting: Internal Medicine

## 2020-09-07 ENCOUNTER — Ambulatory Visit: Payer: Medicare Other | Admitting: Internal Medicine

## 2020-09-07 ENCOUNTER — Other Ambulatory Visit: Payer: Self-pay

## 2020-09-07 DIAGNOSIS — R058 Other specified cough: Secondary | ICD-10-CM

## 2020-09-07 DIAGNOSIS — R0609 Other forms of dyspnea: Secondary | ICD-10-CM

## 2020-09-07 DIAGNOSIS — R0600 Dyspnea, unspecified: Secondary | ICD-10-CM

## 2020-09-07 NOTE — Progress Notes (Signed)
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 Previous eval by Kozlow reported cough x 2009 but pt says this was misunderstanding and Kozlow rec ent  eval which pt says was negative and not helpful all occurring in the fall of 2019   Prior GI eval 08/2017 egd c/w HH and was dilated empirically      History of Present Illness  04/19/2018  Pulmonary/ 1st office eval/Ian Diaz  Chief Complaint  Patient presents with  . Pulmonary Consult    Referred by Dr. Luna Kitchens.  Pt c/o cough x 18 months, worse over the past 3-4 months. His cough is prod, esp in the am with yellow sputum.    Dyspnea:  Not limited by breathing from desired activities   Cough:  Worse first thing am and prod of sev tbsp of yellow mucus / no resp to gerd rx /pred/abx or anoro per pt and notes from Dr Park Breed "not ever a smidge, not even for a short period of time (Dr Kathyrn Lass notes says 90% improved from rx gerd, singulair stop fish oil and excess coffee) Sleep: hob  6 inches or recliner makes no difference  SABA use: none  rec Take your acid suppression but take prilosec 40 mg Take 30- 60 min before your first and last meals of the day  Prednisone 10 mg take  4 each am x 2 days,   2 each am x 2 days,  1 each am x 2 days and stop For drainage / throat tickle   try take CHLORPHENIRAMINE  4 mg ([facility]abs at [facility]) - take one every 4 hours as needed   Please remember to go to the lab department    for your tests - we will call you with the results when they are available.    05/10/2018  f/u ov/Ian Diaz re: cough x summer 2018 with neg allergy/ ent eval better since added H1 Chief Complaint  Patient presents with  . Follow-up    cough getting better, little cough, mucus gets stuck in throat, in AM light yellow mucus  Dyspnea:  Not limited by breathing from desired activities  /very active Cough: still w/in 15 min of waking Sleeping: in recliner x years may but says "only 4 in" SABA use: no inhalers  rec  schedule sinus CT >nl 05/15/18  For drainage / throat tickle try take CHLORPHENIRAMINE  4 mg - take one every 4 hours as needed - Stop pepcid and nasal spray now and then singulair in a month Please schedule a follow up visit in 3 months but call sooner if needed  with all medications /inhalers/ solutions in hand so we can verify exactly what you are taking. This includes all medications from all doctors and over the counters     08/22/2018  f/u ov/Ian Diaz re: cough improved  "99%" off singulair  Chief Complaint  Patient presents with  . Follow-up    Cough is "basically gone". No new co's.   Dyspnea:  Not limited by breathing from desired activities   Cough: much better p rx 1st gen H1 blockers per guidelines using about 3 or 4 per day plus xyzal each am but finds the 1st gen working better   Sleeping: fine / some am throat congestion/ nasal drainage  SABA use: inhalers   rec Stop levocetrazine and continue chlorpheniramine 4 mg 2 at bedtime and as needed during the day   11/20/2018  f/u ov/Ian Diaz re: uacs  Chief Complaint  Patient presents with  . Follow-up    Still has some throat clearing.  Dyspnea:  Not limited by breathing from desired activities   Cough: variably/assoc with throat clearing better on when takes h1 but uses only a few in daytime / does not make him sleepy  Sleeping: still in recliner and no change flat when he does lie down now does better SABA use: none 02:  none  rec Ok to adjust your chlortab during the day to see if it helps your drainage, cough, congestion      10/07/2019  f/u ov/Ian Diaz re:  UACS  new onset doe since Jan 2021  / improves with H1 rx Chief Complaint  Patient presents with  . Acute Visit    DOE x Jan 2021. Started after having MI. He states he gets winded after working outside about 20 min.  Dyspnea:  [place]ill from barn was good in Dec 2020  which was  p mi in November but by Jan 2021 stop half way back and seems to be getting worse  Cough: better off acei  Sleeping: 15 degrees recliner  SABA use none 02: none  rec Prilosec(omeprazole)  40 mg Take 30- 60 min before your first and last meals of the day  GERD diet      12/03/2019  f/u ov/Ian Diaz re: improved doe  Chief Complaint  Patient presents with  . Follow-up    pt states no compliants   Dyspnea:  Walks to barn and uphill back s stopping which is better  Cough: very little /sometimes p eat Sleeping: can sleep flat now/ but thick pillow SABA use: none  02: none  rec No change rx  covid 19  Sept 2021    03/10/2020  f/u ov/Ian Diaz re: doe since Jan 2021  Chief Complaint  Patient presents with  . Follow-up    Breathing seems worse for the past 1-2 wks. He gets winded walking up hill. He states had tested pos for covid 19 beginning of Sept 2021- had monoclonal antibody infusion.   Dyspnea: more doe to barn since cold weather Cough: just in ams x first hour light yellow x years   h1 seems to help some Sleeping: flat bed, couple pillows due to neck  SABA use: none 02: none rec No change in medications  Please remember to go to the  x-ray department  for your tests - we will call you with the results when they are available   Make sure you check your oxygen saturations at highest level of activity to be sure it stays over 90% and adjust  02 flow upward to maintain this level if needed but remember to turn it back to previous settings when you stop (to conserve your  supply).    09/07/2020  f/u ov/Ian Diaz re: uacs since 2018 / doe was just since fall 2021/  Chief Complaint  Patient  presents with  . Follow-up    Still has SOB at times and coughs up sputum   Dyspnea:  Really  Cough: worse p an hour mostly white  Every 2-3 h  Sleeping: recliner at 20-30 degrees  SABA use: none 02: none  Covid status:   vax x 4  Rob dm works the best for his cough Variably worse dysphagia on ppi bid ac and has not seen his GI doctor about it     No obvious day to day or daytime variability or assoc excess/ purulent sputum or mucus plugs or hemoptysis or cp or chest tightness, subjective wheeze or overt sinus or hb symptoms.   Sleeping without nocturnal  or early am exacerbation  of respiratory  c/o's or need for noct saba. Also denies any obvious fluctuation of symptoms with weather or environmental changes or other aggravating or alleviating factors except as outlined above   No unusual exposure hx or h/o childhood pna/ asthma or knowledge of premature birth.  Current Allergies, Complete Past Medical History, Past Surgical History, Family History, and Social History were reviewed in [employer] record.  ROS  The following are not active complaints unless bolded Hoarseness, sore throat, dysphagia, dental problems, itching, sneezing,  nasal congestion or discharge of excess mucus or purulent secretions, ear ache,   fever, chills, sweats, unintended wt loss or wt gain, classically pleuritic or exertional cp,  orthopnea pnd or arm/hand swelling  or leg swelling, presyncope, palpitations, abdominal pain, anorexia, nausea, vomiting, diarrhea  or change in bowel habits or change in bladder habits, change in stools or change in urine, dysuria, hematuria,  rash, arthralgias, visual complaints, headache, numbness, weakness or ataxia or problems with walking or coordination,  change in mood or  memory.        Current Meds  Medication Sig  . amLODipine  (NORVASC) 5 MG tablet Take 5 mg by mouth daily.  Marland Kitchen aspirin EC 81 MG tablet Take 81 mg by mouth daily. Swallow whole.  Marland Kitchen atorvastatin (LIPITOR) 80 MG tablet Take 1 tablet by mouth daily.  . candesartan-hydrochlorothiazide (ATACAND HCT) 16-12.5 MG tablet Take 1 tablet by mouth daily.  . carvedilol (COREG) 25 MG tablet Take 1 tablet by mouth in the morning and at bedtime.  . Ferrous Sulfate (IRON) 325 (65 Fe) MG TABS Take 2 tablets by mouth 2 (two) times daily.   . Multiple Vitamins-Minerals (CENTRUM SILVER) tablet Take 1 tablet by mouth daily.  . nitroGLYCERIN (NITROSTAT) 0.4 MG SL tablet Place under the tongue. As directed when needed  . omeprazole (PRILOSEC) 40 MG capsule Take 30-60 min before first meal of the day                      Objective:    09/07/2020       230 03/10/2020     231 12/03/2019       223  10/07/2019       225  11/20/2018        235   08/22/2018        236   05/10/18 231 lb (104.8 kg)  04/19/18 236 lb 6.4 oz (107.2 kg)  02/21/18 236 lb (107 kg)      Vital signs reviewed  09/07/2020  - Note at rest 02 sats  99% on RA   General appearance:    Well preserved amb wm nad      HEENT : pt wearing mask not removed for exam  due to covid -19 concerns.    NECK :  without JVD/Nodes/TM/ nl carotid upstrokes bilaterally   LUNGS: no acc muscle use,  Nl contour chest which is clear to A and P bilaterally without cough on insp or exp maneuvers   CV:  RRR  no s3 or murmur or increase in P2, and no edema   ABD:  soft and nontender with nl inspiratory excursion in the supine position. No bruits or organomegaly appreciated, bowel sounds nl  MS:  Nl gait/ ext warm without deformities, calf tenderness, cyanosis or clubbing No obvious joint restrictions   SKIN: warm and dry without lesions    NEURO:  alert, approp, nl sensorium with  no motor or cerebellar deficits apparent.           Assessment

## 2020-09-07 NOTE — Patient Instructions (Addendum)
Make sure you check your oxygen saturation at your highest level of activity to be sure it stays over 90% and keep track of it at least once a week, more often if breathing getting worse, and let me know if losing ground.   If the swallowing gets worse, see Dr Elmo Putt  Increase the chlortab to take 2 at bedtime   For cough as needed mucinex dm 1274m twice daily or deslym 2 tsp every 12 hours    Please schedule a follow up visit in 6 months but call sooner if needed

## 2020-09-08 ENCOUNTER — Encounter: Payer: Self-pay | Admitting: Internal Medicine

## 2020-09-08 NOTE — Assessment & Plan Note (Signed)
Onset was summer 2018    Prior GI eval 08/2017 egd c/w HH and was dilated empirically  - Spirometry 04/19/2018  FEV1 2.6 (83%)  Ratio 73 s curvature  s prior rx  - FENO 04/19/2018  =   30  - Allergy profile 04/19/2018 >  Eos 0.4 /  IgE  206 RAST neg  - Dg Es 04/26/2018 1. Small type 1 hiatal hernia. 2. Mild smooth narrowing of the distal esophagus, without irregularity, maximum distended diameter bout 14 mm. A 13 mm barium tablet did pass through this area without difficulty. Although there is no irregularity to suggest malignancy or ulcer, this smooth narrowing could potentially cause symptoms of food sticking. 3. Vallecular and piriform pooling after swallows, otherwise unremarkable pharyngeal phase of swallowing.  - 05/10/18 d/c pepcid, nasal sprays, singulair  - Sinus CT 05/15/2018  Clear paranasal sinuses.  Leftward septal deviation - 08/22/2018 reported marked improvement on xyzal plus chlorpheniramine rec d/c xyzal   For now cough rec 1st gen H1 blockers per guidelines and f/u with GI re dysphagia/ gerd rx going forward.     F/u q 6 m   Each maintenance medication was reviewed in detail including emphasizing most importantly the difference between maintenance and prns and under what circumstances the prns are to be triggered using an action plan format where appropriate.  Total time for H and P, chart review, counseling,  directly observing portions of ambulatory 02 saturation study/ and generating customized AVS unique to this office visit / same day charting = 29 min

## 2020-09-08 NOTE — Assessment & Plan Note (Signed)
Onset 04/2019  p MI / stent 02/2019 - assoc with cough resolved off acei  - Baseline Spirometry 04/19/2018  FEV1 2.6 (83%)  Ratio 0.77 min concavity  To f/v curve  Echo 09/27/19 The left ventricular size is normal.  There is mild concentric left ventricular hypertrophy.  LV ejection fraction = 60-65%.  The left ventricular wall motion is normal.  The left atrium is mildly dilated.  There is mild mitral regurgitation.  There is moderate mitral annular calcification.  Mild, posterior wall-hugging jet.  Estimated right ventricular systolic pressure is 45 mmHg.  -  10/07/2019   Walked RA  2 laps @ approx 220ft each @ moderate pace  stopped due to end of study, sob but  peak HR only 84 and sats 96% on coreg 25 mg bid  -  09/07/2020   Walked RA  3 laps @ approx 299ft each @ moderate pace  stopped due to end of study,  no sob - sats 97% at end   Advised re reg ex and monitor sats at peak levels, report any decline in either tolerance or sats

## 2020-09-14 DIAGNOSIS — E7800 Pure hypercholesterolemia, unspecified: Secondary | ICD-10-CM | POA: Diagnosis not present

## 2020-09-14 DIAGNOSIS — I10 Essential (primary) hypertension: Secondary | ICD-10-CM | POA: Diagnosis not present

## 2020-09-14 DIAGNOSIS — I2510 Atherosclerotic heart disease of native coronary artery without angina pectoris: Secondary | ICD-10-CM | POA: Diagnosis not present

## 2020-09-18 DIAGNOSIS — Z9181 History of falling: Secondary | ICD-10-CM | POA: Diagnosis not present

## 2020-09-18 DIAGNOSIS — I700 Atherosclerosis of aorta: Secondary | ICD-10-CM | POA: Diagnosis not present

## 2020-09-18 DIAGNOSIS — I2510 Atherosclerotic heart disease of native coronary artery without angina pectoris: Secondary | ICD-10-CM | POA: Diagnosis not present

## 2020-11-10 DIAGNOSIS — H43821 Vitreomacular adhesion, right eye: Secondary | ICD-10-CM | POA: Diagnosis not present

## 2020-12-02 DIAGNOSIS — Z0000 Encounter for general adult medical examination without abnormal findings: Secondary | ICD-10-CM | POA: Diagnosis not present

## 2020-12-02 DIAGNOSIS — R053 Chronic cough: Secondary | ICD-10-CM | POA: Diagnosis not present

## 2020-12-31 DIAGNOSIS — R053 Chronic cough: Secondary | ICD-10-CM | POA: Diagnosis not present

## 2020-12-31 DIAGNOSIS — J439 Emphysema, unspecified: Secondary | ICD-10-CM | POA: Diagnosis not present

## 2021-02-04 DIAGNOSIS — Z23 Encounter for immunization: Secondary | ICD-10-CM | POA: Diagnosis not present

## 2021-02-22 DIAGNOSIS — J439 Emphysema, unspecified: Secondary | ICD-10-CM | POA: Diagnosis not present

## 2021-02-22 DIAGNOSIS — R053 Chronic cough: Secondary | ICD-10-CM | POA: Diagnosis not present

## 2021-02-23 DIAGNOSIS — K208 Other esophagitis without bleeding: Secondary | ICD-10-CM | POA: Diagnosis not present

## 2021-02-23 DIAGNOSIS — R1310 Dysphagia, unspecified: Secondary | ICD-10-CM | POA: Diagnosis not present

## 2021-02-23 DIAGNOSIS — T18128A Food in esophagus causing other injury, initial encounter: Secondary | ICD-10-CM | POA: Diagnosis not present

## 2021-02-23 DIAGNOSIS — K209 Esophagitis, unspecified without bleeding: Secondary | ICD-10-CM | POA: Diagnosis not present

## 2021-02-23 DIAGNOSIS — E119 Type 2 diabetes mellitus without complications: Secondary | ICD-10-CM | POA: Diagnosis not present

## 2021-02-24 DIAGNOSIS — Z23 Encounter for immunization: Secondary | ICD-10-CM | POA: Diagnosis not present

## 2021-03-15 ENCOUNTER — Ambulatory Visit: Payer: Medicare Other | Admitting: Internal Medicine

## 2021-03-31 DIAGNOSIS — T18128S Food in esophagus causing other injury, sequela: Secondary | ICD-10-CM | POA: Diagnosis not present

## 2021-03-31 DIAGNOSIS — R053 Chronic cough: Secondary | ICD-10-CM | POA: Diagnosis not present

## 2021-05-26 DIAGNOSIS — R053 Chronic cough: Secondary | ICD-10-CM | POA: Diagnosis not present

## 2021-05-26 DIAGNOSIS — D485 Neoplasm of uncertain behavior of skin: Secondary | ICD-10-CM | POA: Diagnosis not present

## 2021-05-26 DIAGNOSIS — J439 Emphysema, unspecified: Secondary | ICD-10-CM | POA: Diagnosis not present

## 2021-05-31 DIAGNOSIS — K219 Gastro-esophageal reflux disease without esophagitis: Secondary | ICD-10-CM | POA: Diagnosis not present

## 2021-05-31 DIAGNOSIS — I2510 Atherosclerotic heart disease of native coronary artery without angina pectoris: Secondary | ICD-10-CM | POA: Diagnosis not present

## 2021-06-01 DIAGNOSIS — I10 Essential (primary) hypertension: Secondary | ICD-10-CM | POA: Diagnosis not present

## 2021-06-01 DIAGNOSIS — Z7982 Long term (current) use of aspirin: Secondary | ICD-10-CM | POA: Diagnosis not present

## 2021-06-01 DIAGNOSIS — I272 Pulmonary hypertension, unspecified: Secondary | ICD-10-CM | POA: Diagnosis not present

## 2021-06-01 DIAGNOSIS — Z87891 Personal history of nicotine dependence: Secondary | ICD-10-CM | POA: Diagnosis not present

## 2021-06-01 DIAGNOSIS — E782 Mixed hyperlipidemia: Secondary | ICD-10-CM | POA: Diagnosis not present

## 2021-06-01 DIAGNOSIS — I2510 Atherosclerotic heart disease of native coronary artery without angina pectoris: Secondary | ICD-10-CM | POA: Diagnosis not present

## 2021-07-01 DIAGNOSIS — J449 Chronic obstructive pulmonary disease, unspecified: Secondary | ICD-10-CM | POA: Diagnosis not present

## 2021-07-01 DIAGNOSIS — R1310 Dysphagia, unspecified: Secondary | ICD-10-CM | POA: Diagnosis not present

## 2021-07-01 DIAGNOSIS — R053 Chronic cough: Secondary | ICD-10-CM | POA: Diagnosis not present

## 2021-07-01 DIAGNOSIS — K5790 Diverticulosis of intestine, part unspecified, without perforation or abscess without bleeding: Secondary | ICD-10-CM | POA: Diagnosis not present

## 2021-07-06 DIAGNOSIS — R1310 Dysphagia, unspecified: Secondary | ICD-10-CM | POA: Diagnosis not present

## 2021-07-06 DIAGNOSIS — K222 Esophageal obstruction: Secondary | ICD-10-CM | POA: Diagnosis not present

## 2021-08-03 DIAGNOSIS — K222 Esophageal obstruction: Secondary | ICD-10-CM | POA: Diagnosis not present

## 2021-08-03 DIAGNOSIS — R1310 Dysphagia, unspecified: Secondary | ICD-10-CM | POA: Diagnosis not present

## 2021-08-03 DIAGNOSIS — J449 Chronic obstructive pulmonary disease, unspecified: Secondary | ICD-10-CM | POA: Diagnosis not present

## 2021-08-03 DIAGNOSIS — I10 Essential (primary) hypertension: Secondary | ICD-10-CM | POA: Diagnosis not present

## 2021-08-03 DIAGNOSIS — K449 Diaphragmatic hernia without obstruction or gangrene: Secondary | ICD-10-CM | POA: Diagnosis not present

## 2021-08-12 DIAGNOSIS — L719 Rosacea, unspecified: Secondary | ICD-10-CM | POA: Diagnosis not present

## 2021-08-12 DIAGNOSIS — L570 Actinic keratosis: Secondary | ICD-10-CM | POA: Diagnosis not present

## 2021-08-12 DIAGNOSIS — L821 Other seborrheic keratosis: Secondary | ICD-10-CM | POA: Diagnosis not present

## 2021-08-12 DIAGNOSIS — L578 Other skin changes due to chronic exposure to nonionizing radiation: Secondary | ICD-10-CM | POA: Diagnosis not present

## 2021-08-12 DIAGNOSIS — K130 Diseases of lips: Secondary | ICD-10-CM | POA: Diagnosis not present

## 2021-09-20 DIAGNOSIS — K222 Esophageal obstruction: Secondary | ICD-10-CM | POA: Diagnosis not present

## 2021-09-20 DIAGNOSIS — R059 Cough, unspecified: Secondary | ICD-10-CM | POA: Diagnosis not present

## 2021-09-20 DIAGNOSIS — K219 Gastro-esophageal reflux disease without esophagitis: Secondary | ICD-10-CM | POA: Diagnosis not present

## 2021-09-20 DIAGNOSIS — D649 Anemia, unspecified: Secondary | ICD-10-CM | POA: Diagnosis not present

## 2021-09-28 DIAGNOSIS — D485 Neoplasm of uncertain behavior of skin: Secondary | ICD-10-CM | POA: Diagnosis not present

## 2021-09-28 DIAGNOSIS — K130 Diseases of lips: Secondary | ICD-10-CM | POA: Diagnosis not present

## 2021-09-28 DIAGNOSIS — D0439 Carcinoma in situ of skin of other parts of face: Secondary | ICD-10-CM | POA: Diagnosis not present

## 2021-10-26 DIAGNOSIS — L570 Actinic keratosis: Secondary | ICD-10-CM | POA: Diagnosis not present

## 2021-10-26 DIAGNOSIS — D0439 Carcinoma in situ of skin of other parts of face: Secondary | ICD-10-CM | POA: Diagnosis not present

## 2021-11-16 DIAGNOSIS — H43821 Vitreomacular adhesion, right eye: Secondary | ICD-10-CM | POA: Diagnosis not present

## 2022-01-10 DIAGNOSIS — J439 Emphysema, unspecified: Secondary | ICD-10-CM | POA: Diagnosis not present

## 2022-01-10 DIAGNOSIS — I25118 Atherosclerotic heart disease of native coronary artery with other forms of angina pectoris: Secondary | ICD-10-CM | POA: Diagnosis not present

## 2022-01-10 DIAGNOSIS — I10 Essential (primary) hypertension: Secondary | ICD-10-CM | POA: Diagnosis not present

## 2022-01-10 DIAGNOSIS — E7800 Pure hypercholesterolemia, unspecified: Secondary | ICD-10-CM | POA: Diagnosis not present

## 2022-01-10 DIAGNOSIS — D509 Iron deficiency anemia, unspecified: Secondary | ICD-10-CM | POA: Diagnosis not present

## 2022-01-10 DIAGNOSIS — Z0000 Encounter for general adult medical examination without abnormal findings: Secondary | ICD-10-CM | POA: Diagnosis not present

## 2022-01-10 DIAGNOSIS — Z79899 Other long term (current) drug therapy: Secondary | ICD-10-CM | POA: Diagnosis not present

## 2022-01-12 DIAGNOSIS — J069 Acute upper respiratory infection, unspecified: Secondary | ICD-10-CM | POA: Diagnosis not present

## 2022-01-12 DIAGNOSIS — U071 COVID-19: Secondary | ICD-10-CM | POA: Diagnosis not present

## 2022-02-17 DIAGNOSIS — Z23 Encounter for immunization: Secondary | ICD-10-CM | POA: Diagnosis not present

## 2022-02-22 DIAGNOSIS — Z85828 Personal history of other malignant neoplasm of skin: Secondary | ICD-10-CM | POA: Diagnosis not present

## 2022-02-22 DIAGNOSIS — D225 Melanocytic nevi of trunk: Secondary | ICD-10-CM | POA: Diagnosis not present

## 2022-02-22 DIAGNOSIS — L570 Actinic keratosis: Secondary | ICD-10-CM | POA: Diagnosis not present

## 2022-02-22 DIAGNOSIS — Z86008 Personal history of in-situ neoplasm of other site: Secondary | ICD-10-CM | POA: Diagnosis not present

## 2022-02-22 DIAGNOSIS — L821 Other seborrheic keratosis: Secondary | ICD-10-CM | POA: Diagnosis not present

## 2022-02-22 DIAGNOSIS — D485 Neoplasm of uncertain behavior of skin: Secondary | ICD-10-CM | POA: Diagnosis not present

## 2022-02-22 DIAGNOSIS — L820 Inflamed seborrheic keratosis: Secondary | ICD-10-CM | POA: Diagnosis not present

## 2022-03-14 DIAGNOSIS — Z23 Encounter for immunization: Secondary | ICD-10-CM | POA: Diagnosis not present

## 2022-04-26 DIAGNOSIS — K219 Gastro-esophageal reflux disease without esophagitis: Secondary | ICD-10-CM | POA: Diagnosis not present

## 2022-05-27 IMAGING — DX DG CHEST 2V
2 series · 2 of 2 positions shown · non-contrast
Comparison: 10/07/2019.

CLINICAL DATA: Cough.

EXAM:
CHEST - 2 VIEW

[chest pa]
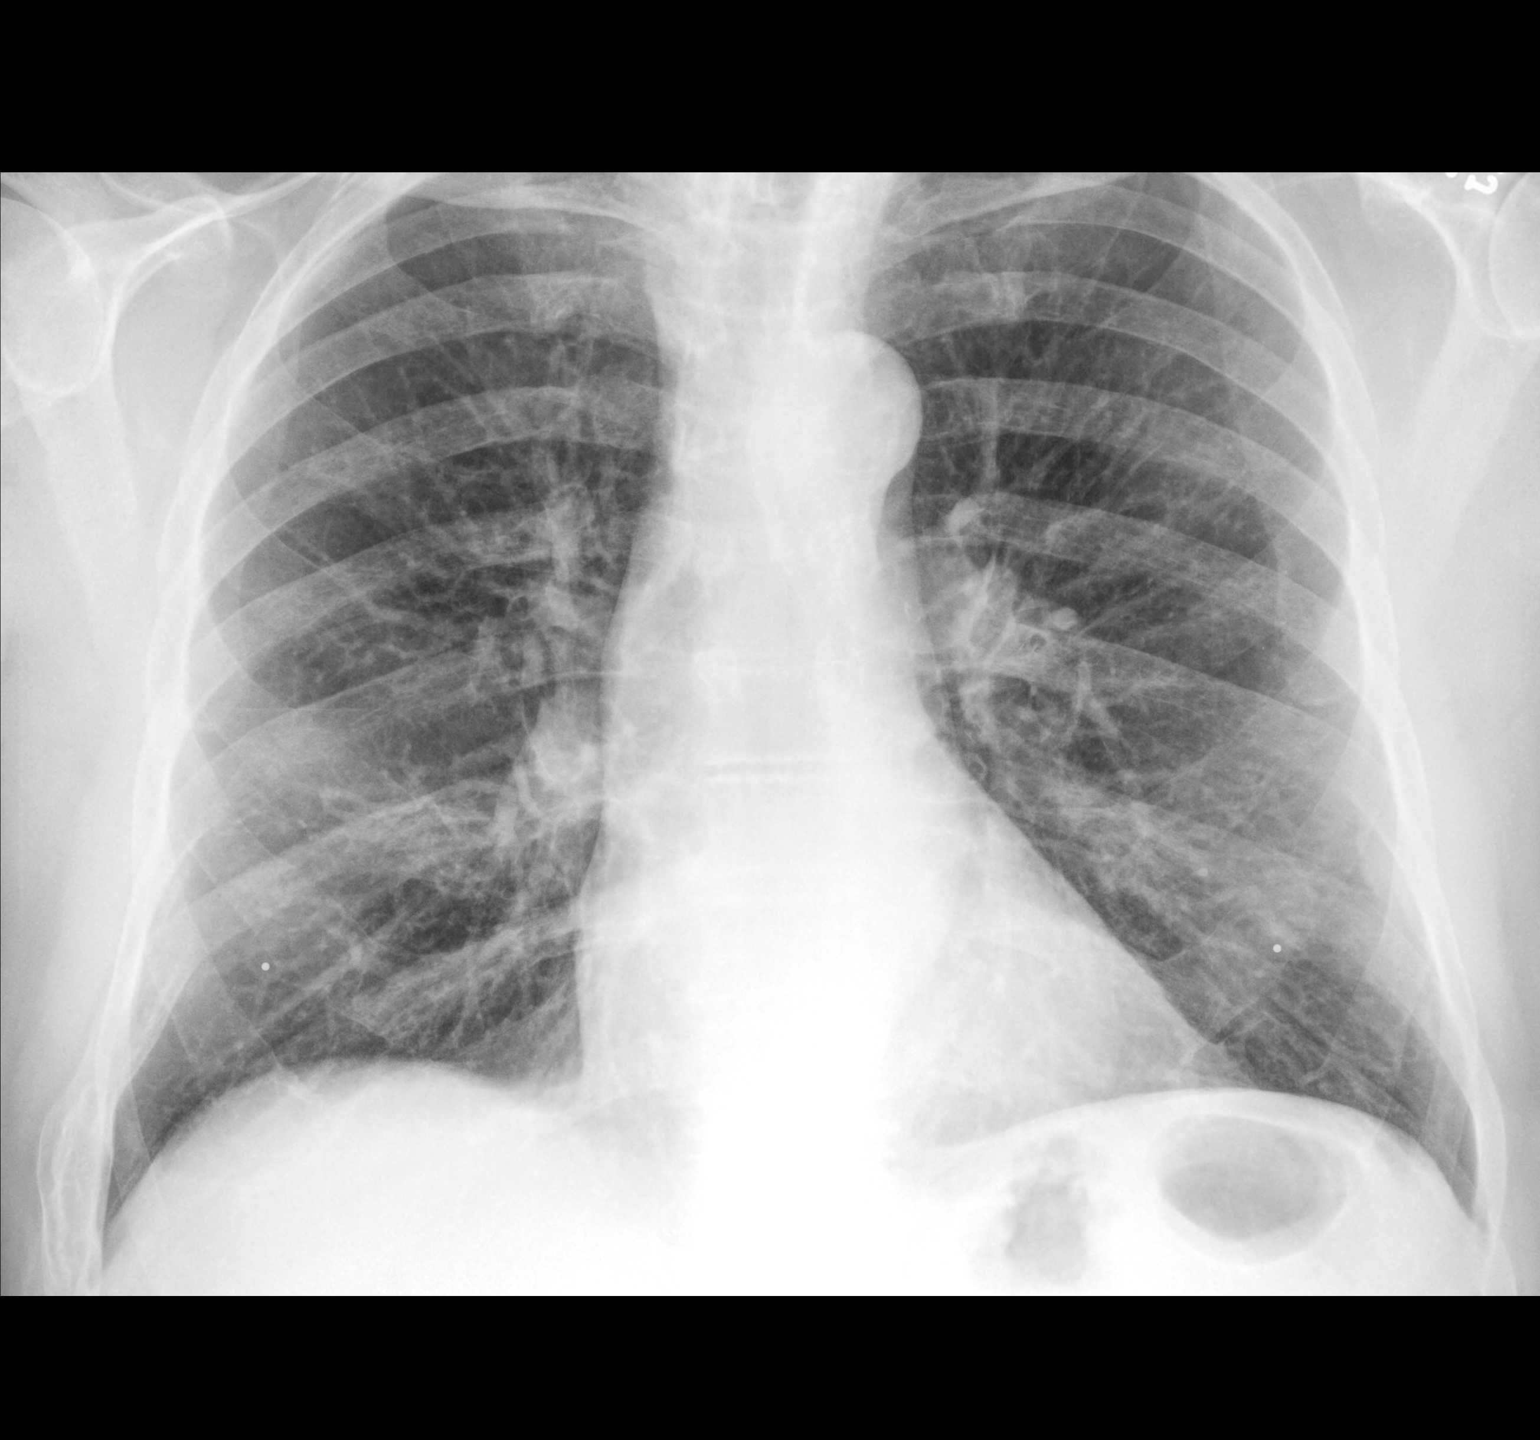

[chest lat]
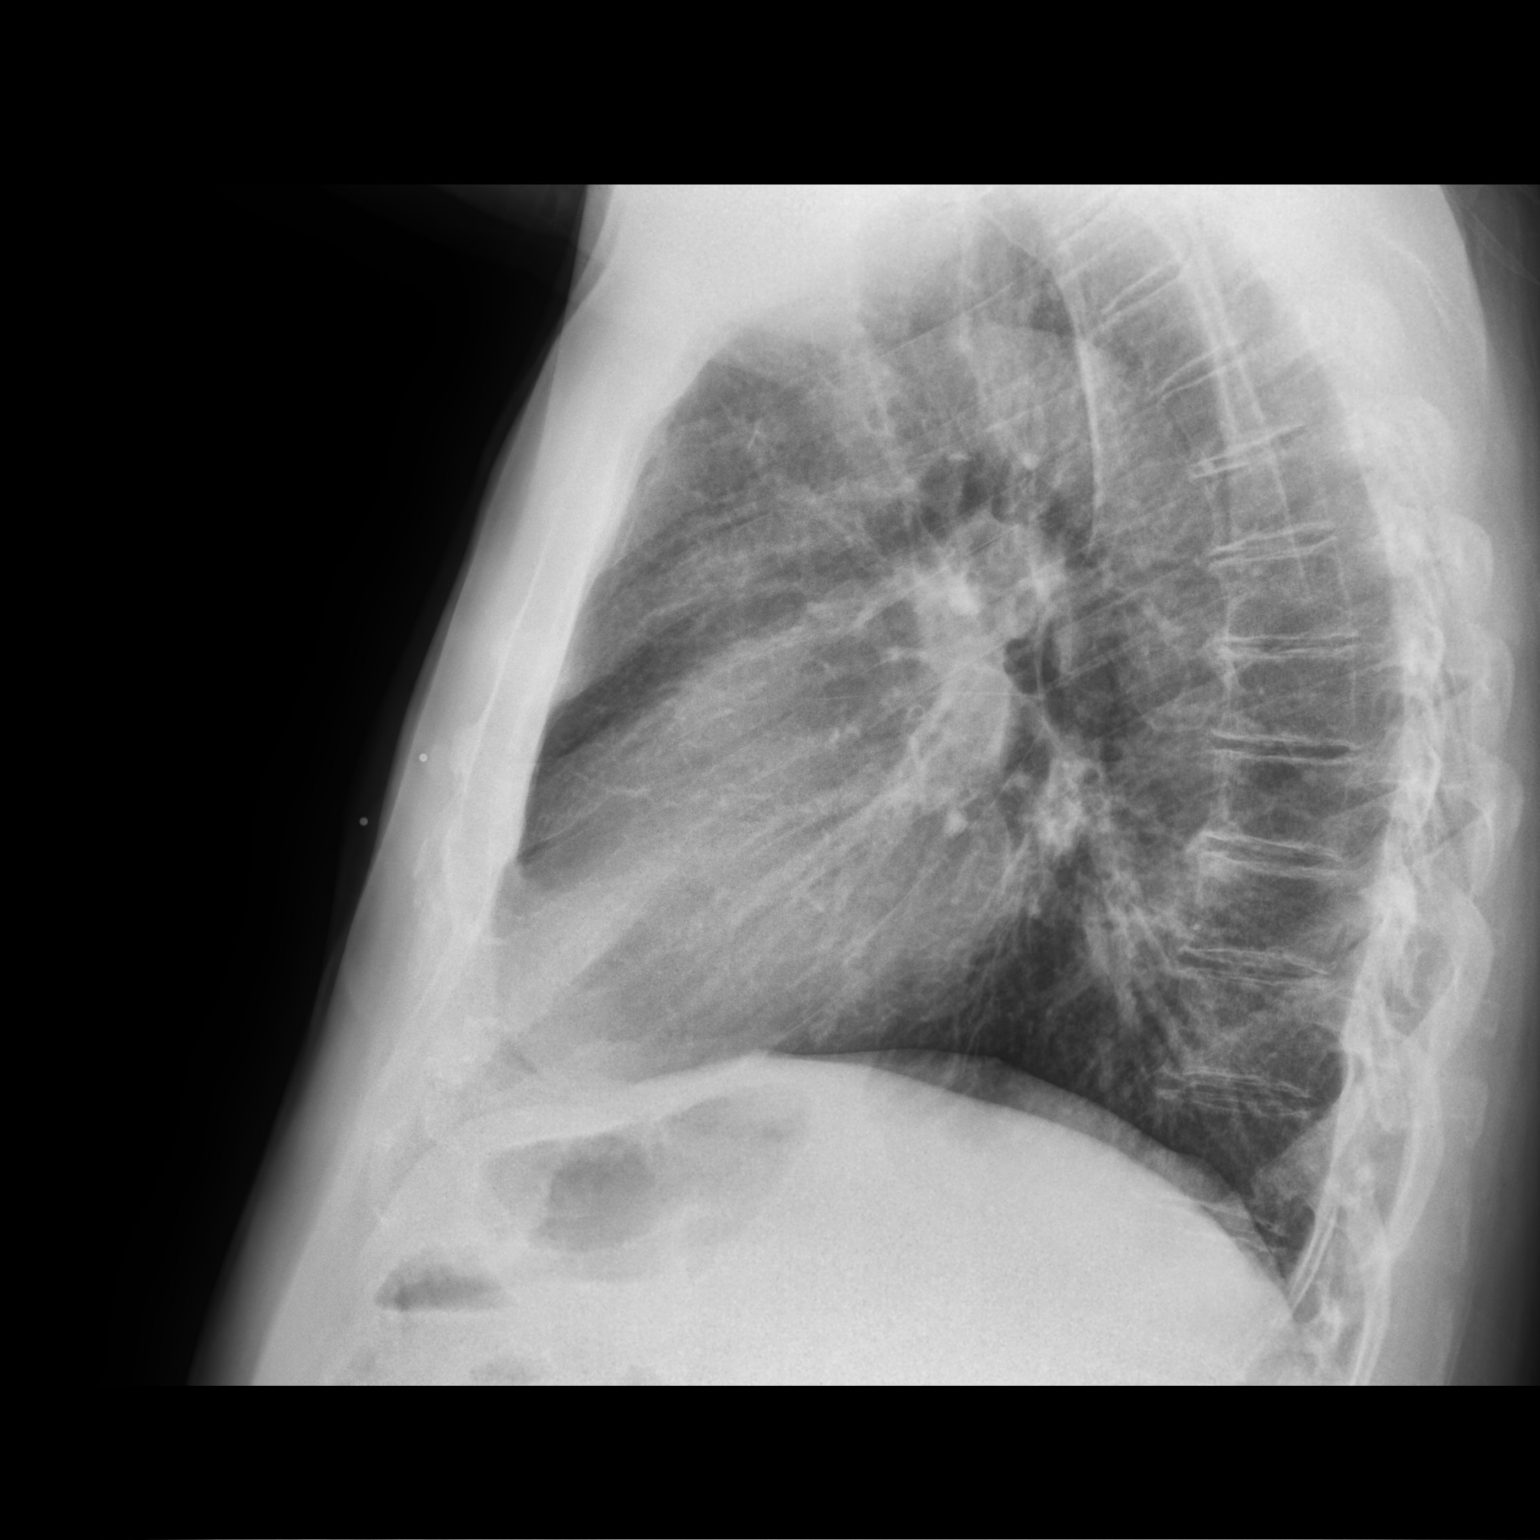

[2 of 2 positions shown; findings below may reference images not displayed]

FINDINGS: Chest x-ray obtained with nipple markers. Mediastinum hilar
structures normal. Previously identified nodular density noted over
the right lung base represents nipple shadow nipple shadow also
noted on the left. Chronic bronchitic changes again noted. Low lung
volumes. Mild basilar atelectasis/infiltrates cannot be excluded. No
pleural effusion or pneumothorax. Stable expansile deformity right
posterolateral ninth rib as pre.
IMPRESSION: 1. Chest x-ray with nipple markers reveals the previously identified
nodule over the right lung base to be a nipple shadow. Left nipple
shadow also noted.

2. Chronic right kidney changes again noted. Low lung volumes. Mild
basilar atelectasis/infiltrates cannot be excluded.

## 2022-06-07 DIAGNOSIS — I272 Pulmonary hypertension, unspecified: Secondary | ICD-10-CM | POA: Diagnosis not present

## 2022-06-07 DIAGNOSIS — Z87891 Personal history of nicotine dependence: Secondary | ICD-10-CM | POA: Diagnosis not present

## 2022-06-07 DIAGNOSIS — I4891 Unspecified atrial fibrillation: Secondary | ICD-10-CM | POA: Insufficient documentation

## 2022-06-07 DIAGNOSIS — R002 Palpitations: Secondary | ICD-10-CM

## 2022-06-07 DIAGNOSIS — Z7982 Long term (current) use of aspirin: Secondary | ICD-10-CM | POA: Diagnosis not present

## 2022-06-07 DIAGNOSIS — I2510 Atherosclerotic heart disease of native coronary artery without angina pectoris: Secondary | ICD-10-CM | POA: Diagnosis not present

## 2022-06-07 DIAGNOSIS — R072 Precordial pain: Secondary | ICD-10-CM | POA: Diagnosis not present

## 2022-06-07 DIAGNOSIS — E782 Mixed hyperlipidemia: Secondary | ICD-10-CM | POA: Diagnosis not present

## 2022-06-07 DIAGNOSIS — I10 Essential (primary) hypertension: Secondary | ICD-10-CM | POA: Diagnosis not present

## 2022-06-07 DIAGNOSIS — R0609 Other forms of dyspnea: Secondary | ICD-10-CM | POA: Diagnosis not present

## 2022-06-07 DIAGNOSIS — I214 Non-ST elevation (NSTEMI) myocardial infarction: Secondary | ICD-10-CM | POA: Diagnosis not present

## 2022-06-07 HISTORY — DX: Palpitations: R00.2

## 2022-06-07 HISTORY — DX: Unspecified atrial fibrillation: I48.91

## 2022-06-13 DIAGNOSIS — I4891 Unspecified atrial fibrillation: Secondary | ICD-10-CM | POA: Diagnosis not present

## 2022-06-13 DIAGNOSIS — R0609 Other forms of dyspnea: Secondary | ICD-10-CM | POA: Diagnosis not present

## 2022-06-13 DIAGNOSIS — I272 Pulmonary hypertension, unspecified: Secondary | ICD-10-CM | POA: Diagnosis not present

## 2022-06-13 DIAGNOSIS — R002 Palpitations: Secondary | ICD-10-CM | POA: Diagnosis not present

## 2022-06-13 DIAGNOSIS — I10 Essential (primary) hypertension: Secondary | ICD-10-CM | POA: Diagnosis not present

## 2022-06-13 DIAGNOSIS — R072 Precordial pain: Secondary | ICD-10-CM | POA: Diagnosis not present

## 2022-07-01 DIAGNOSIS — R0609 Other forms of dyspnea: Secondary | ICD-10-CM | POA: Diagnosis not present

## 2022-07-01 DIAGNOSIS — I272 Pulmonary hypertension, unspecified: Secondary | ICD-10-CM | POA: Diagnosis not present

## 2022-07-04 DIAGNOSIS — I440 Atrioventricular block, first degree: Secondary | ICD-10-CM | POA: Diagnosis not present

## 2022-07-05 DIAGNOSIS — I371 Nonrheumatic pulmonary valve insufficiency: Secondary | ICD-10-CM | POA: Diagnosis not present

## 2022-07-05 DIAGNOSIS — I517 Cardiomegaly: Secondary | ICD-10-CM | POA: Diagnosis not present

## 2022-07-05 DIAGNOSIS — I361 Nonrheumatic tricuspid (valve) insufficiency: Secondary | ICD-10-CM | POA: Diagnosis not present

## 2022-07-19 DIAGNOSIS — E782 Mixed hyperlipidemia: Secondary | ICD-10-CM | POA: Diagnosis not present

## 2022-07-19 DIAGNOSIS — I214 Non-ST elevation (NSTEMI) myocardial infarction: Secondary | ICD-10-CM | POA: Diagnosis not present

## 2022-07-19 DIAGNOSIS — I272 Pulmonary hypertension, unspecified: Secondary | ICD-10-CM | POA: Diagnosis not present

## 2022-07-19 DIAGNOSIS — I2510 Atherosclerotic heart disease of native coronary artery without angina pectoris: Secondary | ICD-10-CM | POA: Diagnosis not present

## 2022-07-19 DIAGNOSIS — Z7982 Long term (current) use of aspirin: Secondary | ICD-10-CM | POA: Diagnosis not present

## 2022-07-19 DIAGNOSIS — I10 Essential (primary) hypertension: Secondary | ICD-10-CM | POA: Diagnosis not present

## 2022-07-21 DIAGNOSIS — J439 Emphysema, unspecified: Secondary | ICD-10-CM | POA: Diagnosis not present

## 2023-01-17 DIAGNOSIS — H3341 Traction detachment of retina, right eye: Secondary | ICD-10-CM | POA: Diagnosis not present

## 2023-01-17 DIAGNOSIS — Z23 Encounter for immunization: Secondary | ICD-10-CM | POA: Diagnosis not present

## 2023-01-25 DIAGNOSIS — L719 Rosacea, unspecified: Secondary | ICD-10-CM | POA: Diagnosis not present

## 2023-01-25 DIAGNOSIS — L821 Other seborrheic keratosis: Secondary | ICD-10-CM | POA: Diagnosis not present

## 2023-01-30 DIAGNOSIS — W44F3XA Food entering into or through a natural orifice, initial encounter: Secondary | ICD-10-CM | POA: Diagnosis not present

## 2023-01-30 DIAGNOSIS — I272 Pulmonary hypertension, unspecified: Secondary | ICD-10-CM | POA: Diagnosis not present

## 2023-01-30 DIAGNOSIS — Z556 Problems related to health literacy: Secondary | ICD-10-CM | POA: Diagnosis not present

## 2023-01-30 DIAGNOSIS — Z7982 Long term (current) use of aspirin: Secondary | ICD-10-CM | POA: Diagnosis not present

## 2023-01-30 DIAGNOSIS — Z79899 Other long term (current) drug therapy: Secondary | ICD-10-CM | POA: Diagnosis not present

## 2023-01-30 DIAGNOSIS — I10 Essential (primary) hypertension: Secondary | ICD-10-CM | POA: Diagnosis not present

## 2023-01-30 DIAGNOSIS — I2510 Atherosclerotic heart disease of native coronary artery without angina pectoris: Secondary | ICD-10-CM | POA: Diagnosis not present

## 2023-01-30 DIAGNOSIS — T18128A Food in esophagus causing other injury, initial encounter: Secondary | ICD-10-CM | POA: Diagnosis not present

## 2023-01-30 DIAGNOSIS — Z87891 Personal history of nicotine dependence: Secondary | ICD-10-CM | POA: Diagnosis not present

## 2023-01-30 DIAGNOSIS — K219 Gastro-esophageal reflux disease without esophagitis: Secondary | ICD-10-CM | POA: Diagnosis not present

## 2023-01-30 DIAGNOSIS — I252 Old myocardial infarction: Secondary | ICD-10-CM | POA: Diagnosis not present

## 2023-01-31 DIAGNOSIS — W44F3XA Food entering into or through a natural orifice, initial encounter: Secondary | ICD-10-CM | POA: Diagnosis not present

## 2023-01-31 DIAGNOSIS — T18128A Food in esophagus causing other injury, initial encounter: Secondary | ICD-10-CM | POA: Diagnosis not present

## 2023-02-02 DIAGNOSIS — T18128D Food in esophagus causing other injury, subsequent encounter: Secondary | ICD-10-CM | POA: Diagnosis not present

## 2023-02-02 DIAGNOSIS — Z0000 Encounter for general adult medical examination without abnormal findings: Secondary | ICD-10-CM | POA: Diagnosis not present

## 2023-02-02 DIAGNOSIS — R053 Chronic cough: Secondary | ICD-10-CM | POA: Diagnosis not present

## 2023-02-02 DIAGNOSIS — Z9181 History of falling: Secondary | ICD-10-CM | POA: Diagnosis not present

## 2023-02-02 DIAGNOSIS — Y708 Miscellaneous anesthesiology devices associated with adverse incidents, not elsewhere classified: Secondary | ICD-10-CM | POA: Diagnosis not present

## 2023-02-13 DIAGNOSIS — R1310 Dysphagia, unspecified: Secondary | ICD-10-CM | POA: Diagnosis not present

## 2023-02-13 DIAGNOSIS — K219 Gastro-esophageal reflux disease without esophagitis: Secondary | ICD-10-CM | POA: Diagnosis not present

## 2023-02-13 DIAGNOSIS — K5790 Diverticulosis of intestine, part unspecified, without perforation or abscess without bleeding: Secondary | ICD-10-CM | POA: Diagnosis not present

## 2023-02-13 DIAGNOSIS — K222 Esophageal obstruction: Secondary | ICD-10-CM | POA: Diagnosis not present

## 2023-02-21 DIAGNOSIS — Z85828 Personal history of other malignant neoplasm of skin: Secondary | ICD-10-CM | POA: Diagnosis not present

## 2023-02-21 DIAGNOSIS — R0602 Shortness of breath: Secondary | ICD-10-CM | POA: Diagnosis not present

## 2023-02-21 DIAGNOSIS — Z5309 Procedure and treatment not carried out because of other contraindication: Secondary | ICD-10-CM | POA: Diagnosis not present

## 2023-02-21 DIAGNOSIS — I252 Old myocardial infarction: Secondary | ICD-10-CM | POA: Diagnosis not present

## 2023-02-21 DIAGNOSIS — Z955 Presence of coronary angioplasty implant and graft: Secondary | ICD-10-CM | POA: Diagnosis not present

## 2023-02-21 DIAGNOSIS — I4891 Unspecified atrial fibrillation: Secondary | ICD-10-CM | POA: Diagnosis not present

## 2023-02-21 DIAGNOSIS — R1310 Dysphagia, unspecified: Secondary | ICD-10-CM | POA: Diagnosis not present

## 2023-02-21 DIAGNOSIS — I10 Essential (primary) hypertension: Secondary | ICD-10-CM | POA: Diagnosis not present

## 2023-02-23 ENCOUNTER — Other Ambulatory Visit: Payer: Self-pay

## 2023-02-23 DIAGNOSIS — D509 Iron deficiency anemia, unspecified: Secondary | ICD-10-CM | POA: Insufficient documentation

## 2023-02-23 DIAGNOSIS — Z860100 Personal history of colon polyps, unspecified: Secondary | ICD-10-CM | POA: Insufficient documentation

## 2023-02-23 DIAGNOSIS — I10 Essential (primary) hypertension: Secondary | ICD-10-CM | POA: Insufficient documentation

## 2023-02-23 DIAGNOSIS — K219 Gastro-esophageal reflux disease without esophagitis: Secondary | ICD-10-CM | POA: Insufficient documentation

## 2023-02-23 DIAGNOSIS — I252 Old myocardial infarction: Secondary | ICD-10-CM | POA: Insufficient documentation

## 2023-02-23 DIAGNOSIS — K222 Esophageal obstruction: Secondary | ICD-10-CM | POA: Insufficient documentation

## 2023-02-23 DIAGNOSIS — Z87891 Personal history of nicotine dependence: Secondary | ICD-10-CM | POA: Insufficient documentation

## 2023-02-23 DIAGNOSIS — R1310 Dysphagia, unspecified: Secondary | ICD-10-CM | POA: Insufficient documentation

## 2023-02-23 DIAGNOSIS — H43821 Vitreomacular adhesion, right eye: Secondary | ICD-10-CM | POA: Diagnosis not present

## 2023-02-23 DIAGNOSIS — K5790 Diverticulosis of intestine, part unspecified, without perforation or abscess without bleeding: Secondary | ICD-10-CM | POA: Insufficient documentation

## 2023-02-23 DIAGNOSIS — Z862 Personal history of diseases of the blood and blood-forming organs and certain disorders involving the immune mechanism: Secondary | ICD-10-CM | POA: Insufficient documentation

## 2023-02-23 DIAGNOSIS — I00 Rheumatic fever without heart involvement: Secondary | ICD-10-CM | POA: Insufficient documentation

## 2023-02-24 ENCOUNTER — Ambulatory Visit: Payer: Medicare Other

## 2023-02-24 VITALS — BP 124/70 | HR 63 | Ht 72.0 in | Wt 234.8 lb

## 2023-02-24 DIAGNOSIS — I4891 Unspecified atrial fibrillation: Secondary | ICD-10-CM

## 2023-02-24 DIAGNOSIS — I2510 Atherosclerotic heart disease of native coronary artery without angina pectoris: Secondary | ICD-10-CM

## 2023-02-24 DIAGNOSIS — R600 Localized edema: Secondary | ICD-10-CM

## 2023-02-24 DIAGNOSIS — Z01810 Encounter for preprocedural cardiovascular examination: Secondary | ICD-10-CM

## 2023-02-24 DIAGNOSIS — I10 Essential (primary) hypertension: Secondary | ICD-10-CM

## 2023-02-24 HISTORY — DX: Encounter for preprocedural cardiovascular examination: Z01.810

## 2023-02-24 HISTORY — DX: Localized edema: R60.0

## 2023-02-24 MED ORDER — APIXABAN 5 MG PO TABS: 5.0000 mg | ORAL_TABLET | Freq: Two times a day (BID) | ORAL | 3 refills | Status: DC

## 2023-02-24 NOTE — Assessment & Plan Note (Addendum)
New diagnosis. Evidence of A-fib on prior EKGs from February 2024 as per the report. Now appears to be persistently in A-fib at least since November 5 has noted that his EGD procedure.  Reviewed pathophysiology of A-fib, potential triggers, potential symptoms and long-term issues related to rate control, stroke risk.  Currently he remains good with his ventricular rate controlled on current regimen. He remains asymptomatic denies any palpitations.   CHA2DS2-VASc Score = 4  The patient's score is based upon: CHF History: 0 HTN History: 1 Diabetes History: 0 Stroke History: 0 Vascular Disease History: 1 Age Score: 2 Gender Score: 0   { Reviewed stroke risk at length. Reviewed options of anticoagulant versus left atrial appendage occluder devices  Agreeable to proceed with Eliquis 5 mg twice daily. This can be held as needed preprocedure 2 to 3 days prior.

## 2023-02-24 NOTE — Assessment & Plan Note (Signed)
Well-controlled. Continue target below 130 over 80 mmHg. Currently on amlodipine 10 mg once daily, carvedilol 12.5 mg twice daily, candesartan/hydrochlorothiazide combination medication 16 mg / 12.5 mg once daily

## 2023-02-24 NOTE — Assessment & Plan Note (Signed)
No signs suggestive of DVT.  He is not sedentary. This been a chronic issue and reduces with keeping his feet elevated. His NT proBNP levels were elevated 1781 02-21-2023.  Recommended he continue to cut back on salt intake in his diet. Keep his feet elevated. Keep fluid intake at 2 L/day. If pedal edema worsening, will consider starting low-dose loop diuretic.

## 2023-02-24 NOTE — Assessment & Plan Note (Signed)
From cardiac standpoint he does not have any acute ongoing symptoms.  Stable cardiac function and no ischemia burden on recent stress test. Rates in A-fib well-controlled. Okay to proceed with esophageal dilatation procedure is being scheduled.  Okay to hold Eliquis 2 to 3 days prior to the procedure and resume postprocedure at the discretion of the surgeon.

## 2023-02-24 NOTE — Progress Notes (Signed)
Cardiology Consultation:    Date:  02/24/2023   ID:  Ian Diaz, DOB 11/11/1935, MRN [id number]  PCP:  Lise Auer, MD  Cardiologist:  Marlyn Corporal Firmin Belisle, MD   Referring MD: Lise Auer, MD   Chief Complaint  Patient presents with   Atrial Fibrillation     ASSESSMENT AND PLAN:   Ian Diaz 87 year old male with history of coronary artery disease NSTEMI in November 2020 s/p PCI with drug-eluting stent, hypertension, hyperlipidemia, prediabetes, former alcohol abuse quit over 5 years ago, former smoker quit in 1990s, proximal esophageal luminal narrowing requiring esophageal dilatation, now diagnosed with atrial fibrillation here for further evaluation.  Problem List Items Addressed This Visit     Hypertension    Well-controlled. Continue target below 130 over 80 mmHg. Currently on amlodipine 10 mg once daily, carvedilol 12.5 mg twice daily, candesartan/hydrochlorothiazide combination medication 16 mg / 12.5 mg once daily       Relevant Medications   apixaban (ELIQUIS) 5 MG TABS tablet   Atrial fibrillation with controlled ventricular response (HCC), persistent, last Zio patch March 2024 reportedly predominantly sinus rhythm - Primary    New diagnosis. Evidence of A-fib on prior EKGs from February 2024 as per the report. Now appears to be persistently in A-fib at least since November 5 has noted that his EGD procedure.  Reviewed pathophysiology of A-fib, potential triggers, potential symptoms and long-term issues related to rate control, stroke risk.  Currently he remains good with his ventricular rate controlled on current regimen. He remains asymptomatic denies any palpitations.   CHA2DS2-VASc Score = 4  The patient's score is based upon: CHF History: 0 HTN History: 1 Diabetes History: 0 Stroke History: 0 Vascular Disease History: 1 Age Score: 2 Gender Score: 0   { Reviewed stroke risk at length. Reviewed options of anticoagulant versus left atrial  appendage occluder devices  Agreeable to proceed with Eliquis 5 mg twice daily. This can be held as needed preprocedure 2 to 3 days prior.      Relevant Medications   apixaban (ELIQUIS) 5 MG TABS tablet   Other Relevant Orders   EKG 12-Lead (Completed)   CAD, h/o NSTEMI and s/p Cath 03/07/2019 with severe RCA stenosis PCI with DES; Lexiscan stress MPI 06/13/2022 normal perfusion, no ischemia/infarct    No significant ischemia burden on last stress test February 2024. No significant functional status changes in the past year. Good functional status, CCS class II.   Currently on aspirin 81 mg once daily and atorvastatin 80 mg once daily. [name] will be starting him on Eliquis for A-fib, aspirin can be discontinued.  On stable antianginal regimen with amlodipine, carvedilol and doses as noted above.  Has not used sublingual nitroglycerin recently.       Relevant Medications   apixaban (ELIQUIS) 5 MG TABS tablet   Preop cardiovascular exam, prior to EGD for esophageal dilatation    From cardiac standpoint he does not have any acute ongoing symptoms.  Stable cardiac function and no ischemia burden on recent stress test. Rates in A-fib well-controlled. Okay to proceed with esophageal dilatation procedure is being scheduled.  Okay to hold Eliquis 2 to 3 days prior to the procedure and resume postprocedure at the discretion of the surgeon.       Bilateral lower extremity edema, possibly suggest chronic heart failure with preserved ejection fraction in the setting of LVH, previous echocardiograms with pulmonary hypertension    No signs suggestive of DVT.  He is  not sedentary. This been a chronic issue and reduces with keeping his feet elevated. His NT proBNP levels were elevated 1781 02-21-2023.  Recommended he continue to cut back on salt intake in his diet. Keep his feet elevated. Keep fluid intake at 2 L/day. If pedal edema worsening, will consider starting low-dose loop diuretic.       Recommend close follow-up with PCP and have evaluation for his urinary symptoms of frequency, if this is prostate or bladder related versus diabetes associated. Return to clinic for follow-up with [place] in 3 months. Send copy of the note to PCPs office and also to the gastroenterologist planning the procedure esophageal dilatation, Dr. Rodrigo Ran  History of Present Illness:    Ian Diaz is a 87 y.o. male who is being seen today for the evaluation of atrial fibrillation at the request of Lise Auer, MD.  Previous visit with Cardiology was 07-19-2022 with Maurie Boettcher, NP with [facility] cardiology in [place].  Has history of coronary artery disease [prior PCI of severe 95% mid RCA  with DES in November 2020 at [facility]], pulmonary hypertension [with RVSP 50 mmHg by TTE 02/2019 not observed on recent TTE 06/2022], hypertension, hyperlipidemia, prediabetes, rheumatic fever as a kid without any cardiac involvement, formal alcohol abuse quit over 5 years ago, former smoker [was smoking up to 3 packs a day, quit in 1990], proximal esophageal luminal narrowing with instances of food bolus obstruction requiring EGD and removal [in 2022 and recently October 2024], currently awaiting esophageal dilatation and incidentally on the procedure on 02/21/2023 noted to be in atrial fibrillation with controlled ventricular rate and sent in for cardiac evaluation.  There were old EKGs that reported atrial fibrillation back in February 2024 but subsequent Zio patch monitor showed predominantly sinus rhythm and he is not aware of diagnosis of A-fib. Denies any prior history of CVA.  Food occasionally gets stuck in the throat has had food impacted which needed EGD D compaction.  Has had esophageal dilatation in the past.  Now pending further procedure for esophageal dilatation.  Here for preop risk assessment in the setting of new diagnosis of A-fib  Here today for the visit accompanied by  his wife.  He previously lived in [place], after marrying his wife in his 36s they moved to [place].  Pleasant gentleman, able to do his activities without any significant limitations.  Mentions he keeps himself active with outdoor work and able to keep up without any significant symptoms.  Although he notes that once he comes back and settles down he can feel a nagging sensation by the medial aspect left elbow that gradually resolves over the next hour or 2.  Denies any chest pain, palpitation, lightheadedness, dizziness or syncopal episodes.  Denies any falls.  He does report getting short of breath when he walks up an incline relieves with rest.  No significant change in overall functional status.  Denies any blood in urine or stools.  Denies any falls in the past year.  Does report increased frequency of urination. Mild bilateral lower extremity edema at times after standing for extended periods.  They are not particular about avoiding salt in the diet but do not add extra other than what is already in the food.  EKG in the clinic today shows atrial fibrillation with ventricular rate 63/min, QRS duration 94 ms. EKG from 02/21/2023 noted atrial fibrillation with ventricular rate 68/min, QRS duration 90 ms nonspecific T wave abnormalities. EKG interpretation from 06-07-2022  available on Care Everywhere, without the scanned copy of the EKG also documented atrial fibrillation.   06/13/2022 LEXISCAN STRESS TEST SPECT images demonstrate normal perfusion at rest. SPECT images demonstrate normal homogenous tracer distribution throughout the myocardium. The left ventricular chamber dimensions are normal. Normal systolic function. The calculated ejection fraction is 51%. EDV stress: 103 mL. ESV stress; 50 mL pharmacologic stress nuclear study is normal, no significant ischemia detected, no evidence of infarct.  07/05/2022 ECHO Left ventricular size is normal. Moderate eccentric left ventricular  hypertrophy. LV ejection fraction 65-70% No segmental wall motion abnormality seen in the left ventricle. The right ventricle is normal in size and function. The left atrium is moderately dilated. The right atrium is mildly dilated. There is mild tricuspid regurgitation. No pulmonary hypertension. Trace to mild pulmonic valvular regurgitation. The aortic sinus is normal size. IVC was normal. There is no pericardial effusion. Compared to prior study, RVSP has decreased  06/24/2022 ZIO MONITOR Patient had a minimum heart rate of 48 bpm, max heart rate of 129 bpm and average heart rate of 64 bpm. Predominant underlying rhythm was sinus rhythm. First-degree AV block was present. 12 supraventricular tachycardia runs occurred, the run with the fastest interval lasting 10 beats with a max rate of 129 bpm (average 108 bpm); the run with the fastest interval was also the longest. Isolated SVE's were rare. SVE couplets were rare and SVE triplets were rare. Isolated VE's were rare. VE couplets were rare and no VE triplets were present   Last lipid panel to review is from 01/10/2022 total cholesterol 84, HDL 29, triglycerides 129  Recent blood work from 02/21/2023 at [facility] notes WBC 5.1, hemoglobin 13.6, hematocrit 40.3 Platelets 157 Sodium 137, potassium 4, BUN 16, creatinine 1.1, EGFR greater than 60 NT proBNP elevated 1780 Normal transaminases and mildly reduced alkaline phosphatase at 48. Troponin I was unremarkable less than 0.01  Last TSH levels checked from 06-07-2022 was normal 1.48. Last hemoglobin A1c available to review is from 03-07-2019, 5.7 suggesting prediabetes     Past Medical History:  Diagnosis Date   Acid reflux    Atrial fibrillation with controlled ventricular response (HCC) 06/07/2022   Chest pain 03/07/2019   Chronic coronary artery disease 03/09/2019   LHC 03/07/2019 - 1 vessel CAD severe RCA stenosis s/p PCI/DES.     Diverticulosis    DOE (dyspnea on  exertion) 10/07/2019   Onset 04/2019  p MI / stent 02/2019  - assoc with cough resolved off acei   - Baseline Spirometry 04/19/2018  FEV1 2.6 (83%)  Ratio 0.77 min concavity  To f/v curve   Echo 09/27/19 The left ventricular size is normal.    There is mild concentric left ventricular hypertrophy.    LV ejection fraction = 60-65%.    The left ventricular wall motion is normal.    The left atrium is mildly dilated.    There    Dysphagia    Esophageal reflux disease    Esophageal stricture    Former smoker 03/19/2019   Heart palpitations 06/07/2022   History of anemia    History of colon polyps    History of heart attack    History of tobacco abuse    Hypertension    Hypertension, benign    Iron deficiency anemia    Long-term use of aspirin therapy 03/12/2019   Mixed hyperlipidemia 03/12/2019   NSTEMI (non-ST elevated myocardial infarction) (HCC) 03/07/2019   Added automatically from request for surgery [id number]  LHC 03/07/2019 sever RCA stenosis s/p DES.     Pulmonary hypertension (HCC) 03/12/2019   Rheumatic fever childhood   Solitary pulmonary nodule on lung CT 05/10/2018   Chest CT 02/28/18  1) enchondroma R lat 9th rib stable since 2009   2) 9 mm RLL well circ rec 6-12 m f/u > CT 03/05/2019  slt decrease > no directed f/u needed         Swelling of right lower extremity 03/07/2019   Upper airway cough syndrome 04/19/2018   Onset was summer 2018     Prior GI eval 08/2017 egd c/w HH and was dilated empirically   - Spirometry 04/19/2018  FEV1 2.6 (83%)  Ratio 73 s curvature  s prior rx   - FENO 04/19/2018  =   30   - Allergy profile 04/19/2018 >  Eos 0.4 /  IgE  206 RAST neg   - Dg Es 04/26/2018 1. Small type 1 hiatal hernia.  2. Mild smooth narrowing of the distal esophagus, without  irregularity, maximum distended diameter bo    Past Surgical History:  Procedure Laterality Date   ADENOIDECTOMY     BREAST SURGERY     Tumor   HERNIA REPAIR     SKIN CANCER EXCISION     STENT PLACEMENT VASCULAR  ([facility] HX)     TONSILLECTOMY      Current Medications: Current Meds  Medication Sig   amLODipine (NORVASC) 10 MG tablet Take 10 mg by mouth daily.   apixaban (ELIQUIS) 5 MG TABS tablet Take 1 tablet (5 mg total) by mouth 2 (two) times daily.   atorvastatin (LIPITOR) 80 MG tablet Take 1 tablet by mouth daily.   candesartan-hydrochlorothiazide (ATACAND HCT) 16-12.5 MG tablet Take 1 tablet by mouth daily.   carvedilol (COREG) 12.5 MG tablet Take 12.5 mg by mouth 2 (two) times daily with a meal.   famotidine (PEPCID) 20 MG tablet Take 1 tablet by mouth at bedtime.   ferrous sulfate 325 (65 FE) MG tablet Take 1 tablet by mouth daily.   montelukast (SINGULAIR) 10 MG tablet Take 1 tablet by mouth at bedtime.   Multiple Vitamins-Minerals (CENTRUM SILVER PO) Take 1 tablet by mouth daily. 0.4 mg-300 mcg-250 mcg   nitroGLYCERIN (NITROSTAT) 0.4 MG SL tablet Place 0.4 mg under the tongue every 5 (five) minutes as needed for chest pain.   omeprazole (PRILOSEC) 40 MG capsule Take 40 mg by mouth 2 (two) times daily.   [DISCONTINUED] apixaban (ELIQUIS) 5 MG TABS tablet Take 1 tablet (5 mg total) by mouth 2 (two) times daily.   [DISCONTINUED] aspirin EC 81 MG tablet Take 81 mg by mouth daily. Swallow whole.     Allergies:   Patient has no known allergies.   Social History   Socioeconomic History   Marital status: Married    Spouse name: Not on file   Number of children: Not on file   Years of education: Not on file   Highest education level: Not on file  Occupational History   Not on file  Tobacco Use   Smoking status: Former    Current packs/day: 0.00    Average packs/day: 3.0 packs/day for 40.0 years (120.0 ttl pk-yrs)    Types: Cigarettes    Start date: 65    Quit date: 42    Years since quitting: 34.8   Smokeless tobacco: Never  Vaping Use   Vaping status: Never Used  Substance and Sexual Activity   Alcohol use: Yes  Comment: Social    Drug use: Never   Sexual activity: Not on  file  Other Topics Concern   Not on file  Social History Narrative   Not on file   Social Determinants of Health   Financial Resource Strain: Not on file  Food Insecurity: Not on file  Transportation Needs: Not on file  Physical Activity: Not on file  Stress: Not on file  Social Connections: Not on file     Family History: The patient's family history includes COPD in his brother; Heart attack in his mother; High blood pressure in his father and mother; Stroke in his father. ROS:   Please see the history of present illness.    All 14 point review of systems negative except as described per history of present illness.  EKGs/Labs/Other Studies Reviewed:    The following studies were reviewed today:   EKG:  EKG Interpretation Date/Time:  Friday February 24 2023 09:37:55 EST Ventricular Rate:  63 PR Interval:    QRS Duration:  94 QT Interval:  430 QTC Calculation: 440 R Axis:   62  Text Interpretation: Atrial fibrillation Abnormal ECG No previous ECGs available Reconfirmed by Huntley Dec reddy [phone]) on 02/24/2023 10:18:22 AM    Recent Labs: No results found for requested labs within last 365 days.  Recent Lipid Panel No results found for: "CHOL", "TRIG", "HDL", "CHOLHDL", "VLDL", "LDLCALC", "LDLDIRECT"  Physical Exam:    VS:  BP 124/70 (BP Location: Right Arm, Patient Position: Sitting)   Pulse 63   Ht 6' (1.829 m)   Wt 234 lb 12.8 oz (106.5 kg)   SpO2 95%   BMI 31.84 kg/m     Wt Readings from Last 3 Encounters:  02/24/23 234 lb 12.8 oz (106.5 kg)  09/07/20 230 lb 12.8 oz (104.7 kg)  03/10/20 231 lb (104.8 kg)     GENERAL:  Well nourished, well developed in no acute distress NECK: No JVD; No carotid bruits CARDIAC: Irregularly irregular, S1 and S2 present, no murmurs, no rubs, no gallops CHEST:  Clear to auscultation without rales, wheezing or rhonchi  Extremities: 1+ bilateral pitting pedal edema extending above the ankles. Pulses bilaterally  symmetric with radial 2+ and dorsalis pedis 2+ NEUROLOGIC:  Alert and oriented x 3  Medication Adjustments/Labs and Tests Ordered: Current medicines are reviewed at length with the patient today.  Concerns regarding medicines are outlined above.  Orders Placed This Encounter  Procedures   EKG 12-Lead   Meds ordered this encounter  Medications   DISCONTD: apixaban (ELIQUIS) 5 MG TABS tablet    Sig: Take 1 tablet (5 mg total) by mouth 2 (two) times daily.    Dispense:  180 tablet    Refill:  3   apixaban (ELIQUIS) 5 MG TABS tablet    Sig: Take 1 tablet (5 mg total) by mouth 2 (two) times daily.    Dispense:  180 tablet    Refill:  3    Signed, Polo Mcmartin reddy Graceanna Theissen, MD, MPH, [facility]. 02/24/2023 10:38 AM    [facility]

## 2023-02-24 NOTE — Assessment & Plan Note (Signed)
No significant ischemia burden on last stress test February 2024. No significant functional status changes in the past year. Good functional status, CCS class II.   Currently on aspirin 81 mg once daily and atorvastatin 80 mg once daily. [name] will be starting him on Eliquis for A-fib, aspirin can be discontinued.  On stable antianginal regimen with amlodipine, carvedilol and doses as noted above.  Has not used sublingual nitroglycerin recently.

## 2023-02-24 NOTE — Patient Instructions (Addendum)
Medication Instructions:  Your physician has recommended you make the following change in your medication:   START: Eliquis 5 mg two times daily (Once you start this medication stop your Aspirin) STOP: Aspirin  *If you need a refill on your cardiac medications before your next appointment, please call your pharmacy*   Lab Work: None If you have labs (blood work) drawn today and your tests are completely normal, you will receive your results only by: MyChart Message (if you have MyChart) OR A paper copy in the mail If you have any lab test that is abnormal or we need to change your treatment, we will call you to review the results.   Testing/Procedures: None   Follow-Up: At [facility], you and your health needs are our priority.  As part of our continuing mission to provide you with exceptional heart care, we have created designated Provider Care Teams.  These Care Teams include your primary Cardiologist (physician) and Advanced Practice Providers (APPs -  Physician Assistants and Nurse Practitioners) who all work together to provide you with the care you need, when you need it.  We recommend signing up for the patient portal called "MyChart".  Sign up information is provided on this After Visit Summary.  MyChart is used to connect with patients for Virtual Visits (Telemedicine).  Patients are able to view lab/test results, encounter notes, upcoming appointments, etc.  Non-urgent messages can be sent to your provider as well.   To learn more about what you can do with MyChart, go to [email or web address].    Your next appointment:   3 month(s)  Provider:   Huntley Dec, MD    Other Instructions None

## 2023-03-02 DIAGNOSIS — K449 Diaphragmatic hernia without obstruction or gangrene: Secondary | ICD-10-CM | POA: Diagnosis not present

## 2023-03-02 DIAGNOSIS — K222 Esophageal obstruction: Secondary | ICD-10-CM | POA: Diagnosis not present

## 2023-03-02 DIAGNOSIS — I252 Old myocardial infarction: Secondary | ICD-10-CM | POA: Diagnosis not present

## 2023-03-02 DIAGNOSIS — I4891 Unspecified atrial fibrillation: Secondary | ICD-10-CM | POA: Diagnosis not present

## 2023-03-02 DIAGNOSIS — D649 Anemia, unspecified: Secondary | ICD-10-CM | POA: Diagnosis not present

## 2023-03-02 DIAGNOSIS — I10 Essential (primary) hypertension: Secondary | ICD-10-CM | POA: Diagnosis not present

## 2023-03-02 DIAGNOSIS — R1310 Dysphagia, unspecified: Secondary | ICD-10-CM | POA: Diagnosis not present

## 2023-03-14 DIAGNOSIS — R609 Edema, unspecified: Secondary | ICD-10-CM | POA: Diagnosis not present

## 2023-03-14 DIAGNOSIS — J439 Emphysema, unspecified: Secondary | ICD-10-CM | POA: Diagnosis not present

## 2023-03-14 DIAGNOSIS — I4891 Unspecified atrial fibrillation: Secondary | ICD-10-CM | POA: Diagnosis not present

## 2023-03-27 DIAGNOSIS — D509 Iron deficiency anemia, unspecified: Secondary | ICD-10-CM | POA: Diagnosis not present

## 2023-03-27 DIAGNOSIS — R609 Edema, unspecified: Secondary | ICD-10-CM | POA: Diagnosis not present

## 2023-04-03 ENCOUNTER — Ambulatory Visit: Payer: Medicare Other

## 2023-04-03 VITALS — BP 142/56 | HR 67 | Ht 72.0 in | Wt 232.6 lb

## 2023-04-03 DIAGNOSIS — R600 Localized edema: Secondary | ICD-10-CM | POA: Diagnosis not present

## 2023-04-03 DIAGNOSIS — I4891 Unspecified atrial fibrillation: Secondary | ICD-10-CM | POA: Diagnosis not present

## 2023-04-03 DIAGNOSIS — I2510 Atherosclerotic heart disease of native coronary artery without angina pectoris: Secondary | ICD-10-CM

## 2023-04-03 DIAGNOSIS — I509 Heart failure, unspecified: Secondary | ICD-10-CM

## 2023-04-03 DIAGNOSIS — I5043 Acute on chronic combined systolic (congestive) and diastolic (congestive) heart failure: Secondary | ICD-10-CM | POA: Insufficient documentation

## 2023-04-03 DIAGNOSIS — I10 Essential (primary) hypertension: Secondary | ICD-10-CM

## 2023-04-03 HISTORY — DX: Heart failure, unspecified: I50.9

## 2023-04-03 HISTORY — DX: Acute on chronic combined systolic (congestive) and diastolic (congestive) heart failure: I50.43

## 2023-04-03 NOTE — Progress Notes (Signed)
Cardiology Consultation:    Date:  04/03/2023   ID:  Ian Diaz, DOB [birth year], MRN [id number]  PCP:  Lise Auer, MD  Cardiologist:  Marlyn Corporal Jalina Blowers, MD   Referring MD: Lise Auer, MD   No chief complaint on file.    ASSESSMENT AND PLAN:   Mr Ian Diaz 87 year old male with history of CAD single-vessel disease s/p PCI of RCA in November 2020 last stress test February 2024 without ischemia, pulmonary hypertension on prior echocardiogram in November 2020, not evident on recent echocardiogram from March 2024, atrial fibrillation diagnosed 02/21/2023 on EKG at [facility] [started on anticoagulation with Eliquis subsequently], hypertension, hyperlipidemia, CKD stage III, former smoker, former alcohol abuse history, seen today for further evaluation in the setting of ongoing shortness of breath and worsening pedal edema.  Problem List Items Addressed This Visit     Hypertension   Suboptimal at this time.  Given the elderly age tried target below 140 over 80 mmHg. Continue current medications amlodipine, carvedilol, candesartan/hydrochlorothiazide combination medication.       Relevant Medications   furosemide (LASIX) 40 MG tablet   Other Relevant Orders   Basic metabolic panel   Atrial fibrillation with controlled ventricular response (HCC), persistent, last Zio patch March 2024 reportedly predominantly sinus rhythm - Primary   Persistent A-fib.  Remains in A-fib by EKG today Rate controlled. Continue Eliquis 5 mg twice daily. Reassured that Eliquis is unlikely to be contributing to his swelling in the legs.  CHADS2 VASc score 4.       Relevant Medications   furosemide (LASIX) 40 MG tablet   Other Relevant Orders   EKG 12-Lead (Completed)   CAD, h/o NSTEMI and s/p Cath 03/07/2019 with severe RCA stenosis PCI with DES; Lexiscan stress MPI 06/13/2022 normal perfusion, no ischemia/infarct   Doing well.  No anginal symptoms. CCS class II.  Walks up to a  mile a day.  Aspirin discontinued after he was started on Eliquis. Continue with amlodipine, carvedilol at current doses 10 mg once a day and 12.5 mg twice respectively.       Relevant Medications   furosemide (LASIX) 40 MG tablet   Other Relevant Orders   EKG 12-Lead (Completed)   Basic metabolic panel   Bilateral lower extremity edema, possibly suggest chronic heart failure with preserved ejection fraction in the setting of LVH, previous echocardiograms with pulmonary hypertension   Relevant Orders   Pro b natriuretic peptide (BNP)   Acute on chronic combined systolic and diastolic CHF (congestive heart failure) (HCC)   Elevated BN P 248. Hypervolemic with bilateral lower extremity edema 2+ pitting type.  Continue furosemide 40 mg once a day in the morning. Additional furosemide 40 mg in the afternoons recommended to be taken for the next 5 days.  Repeat blood work early next week for BMP, proBNP.      Relevant Medications   furosemide (LASIX) 40 MG tablet   Return to clinic for follow-up in 2 weeks.    History of Present Illness:    Ian Diaz is a 87 y.o. male who is being seen today for follow-up. Last visit with [place] in the office was 02/24/2023. PCP is Lise Auer, MD.  His wife requested an appointment to be expedited today and was scheduled for a visit with me this afternoon.  Has history of CAD [severe single-vessel disease s/p PCI of RCA 02/2019 at [facility]; last stress test with Lexiscan nuclear imaging February 2024 at [facility]  showed no ischemia], pulmonary hypertension by echocardiogram estimated RVSP 50 mmHg November 2020 but not evident on repeat recent echocardiogram March 2024, atrial fibrillation [recently diagnosed on EKG at [facility] 02/21/2023 while being evaluated for esophageal dilatation], hypertension, hyperlipidemia, CKD stage III, former smoker [quit in 1990; smoking up to 3 packs/day for 40 years], former alcohol abuse  [quit in 2020; drinking up to a gallon a day], food impaction requiring esophageal dilatation in the past and more recentlyDone at [facility] 03/02/2023.   Last echocardiogram results from March 2024 LVEF 65 to 70%, moderate LVH, moderately dilated left atrium, mildly dilated right atrium, mild TR  Had Zio patch monitor in March 2024 that showed short runs of ectopic atrial tachycardia.  Longest episode lasting 10 beats.  Average heart rate 64/min [minimum 48, maximum 129 bpm].   Here for the visit today mentions he has been having increased swelling in bilateral lower extremities since his recent diagnosis with atrial fibrillation on February 21, 2023.  Associated with shortness of breath with walking. He attributes this to having been started on Eliquis for anticoagulation after the A-fib diagnosis.  He was also started on furosemide 40 mg once daily which she has been taking consistently in the morning and feels like he is been peeing more and helped bring the swelling in the legs down but has not completely resolved.  Denies any chest pain denies any palpitations.  Denies any lightheadedness or syncopal episodes. Denies any blood in urine or stools. Does have nocturia frequently for years. Denies any dysuria.  Denies any fever or chills.  Quit smoking in 1990.  Prior to that was smoking 3 packs a day for up to 40 years. Quit alcohol about 4 years ago, prior to that was heavy alcohol drinker up to a gallon that day by his own account. No recreational drug use.  Recent blood work from 03-29-2023 through PCPs office reviewed BNP 248, elevated Sodium 141, potassium 3.5 [mildly reduced] BUN 28, creatinine 1.3, EGFR 53, suggest CKD stage III Hemoglobin 11.2, hematocrit 33, WBC 4.9, platelets 175  EKG in the clinic today shows atrial fibrillation with heart rate 69/min, QRS duration 94 ms.  QTc 458 ms.  In comparison to prior EKG from 02/24/2023 no significant change.  Past  Medical History:  Diagnosis Date   Acid reflux    Atrial fibrillation with controlled ventricular response (HCC) 06/07/2022   Chest pain 03/07/2019   Chronic coronary artery disease 03/09/2019   LHC 03/07/2019 - 1 vessel CAD severe RCA stenosis s/p PCI/DES.     Diverticulosis    DOE (dyspnea on exertion) 10/07/2019   Onset 04/2019  p MI / stent 02/2019  - assoc with cough resolved off acei   - Baseline Spirometry 04/19/2018  FEV1 2.6 (83%)  Ratio 0.77 min concavity  To f/v curve   Echo 09/27/19 The left ventricular size is normal.    There is mild concentric left ventricular hypertrophy.    LV ejection fraction = 60-65%.    The left ventricular wall motion is normal.    The left atrium is mildly dilated.    There    Dysphagia    Esophageal reflux disease    Esophageal stricture    Former smoker 03/19/2019   Heart palpitations 06/07/2022   History of anemia    History of colon polyps    History of heart attack    History of tobacco abuse    Hypertension    Hypertension, benign  Iron deficiency anemia    Long-term use of aspirin therapy 03/12/2019   Mixed hyperlipidemia 03/12/2019   NSTEMI (non-ST elevated myocardial infarction) (HCC) 03/07/2019   Added automatically from request for surgery [id number]  [facility] 03/07/2019 sever RCA stenosis s/p DES.     Pulmonary hypertension (HCC) 03/12/2019   Rheumatic fever childhood   Solitary pulmonary nodule on lung CT 05/10/2018   Chest CT 02/28/18  1) enchondroma R lat 9th rib stable since 2009   2) 9 mm RLL well circ rec 6-12 m f/u > CT 03/05/2019  slt decrease > no directed f/u needed         Swelling of right lower extremity 03/07/2019   Upper airway cough syndrome 04/19/2018   Onset was summer 2018     Prior GI eval 08/2017 egd c/w HH and was dilated empirically   - Spirometry 04/19/2018  FEV1 2.6 (83%)  Ratio 73 s curvature  s prior rx   - FENO 04/19/2018  =   30   - Allergy profile 04/19/2018 >  Eos 0.4 /  IgE  206 RAST neg   - Dg Es 04/26/2018 1. Small type  1 hiatal hernia.  2. Mild smooth narrowing of the distal esophagus, without  irregularity, maximum distended diameter bo    Past Surgical History:  Procedure Laterality Date   ADENOIDECTOMY     BREAST SURGERY     Tumor   HERNIA REPAIR     SKIN CANCER EXCISION     STENT PLACEMENT VASCULAR ([facility] HX)     TONSILLECTOMY      Current Medications: Current Meds  Medication Sig   amLODipine (NORVASC) 10 MG tablet Take 10 mg by mouth daily.   apixaban (ELIQUIS) 5 MG TABS tablet Take 1 tablet (5 mg total) by mouth 2 (two) times daily.   atorvastatin (LIPITOR) 80 MG tablet Take 1 tablet by mouth daily.   candesartan-hydrochlorothiazide (ATACAND HCT) 16-12.5 MG tablet Take 1 tablet by mouth daily.   carvedilol (COREG) 12.5 MG tablet Take 12.5 mg by mouth 2 (two) times daily with a meal.   famotidine (PEPCID) 20 MG tablet Take 1 tablet by mouth at bedtime.   ferrous sulfate 325 (65 FE) MG tablet Take 1 tablet by mouth daily.   furosemide (LASIX) 40 MG tablet Take 40 mg by mouth daily as needed.   montelukast (SINGULAIR) 10 MG tablet Take 1 tablet by mouth at bedtime.   Multiple Vitamins-Minerals (CENTRUM SILVER PO) Take 1 tablet by mouth daily. 0.4 mg-300 mcg-250 mcg   omeprazole (PRILOSEC) 40 MG capsule Take 40 mg by mouth 2 (two) times daily.   Potassium Chloride ER 20 MEQ TBCR Take 1 tablet by mouth daily.     Allergies:   Patient has no known allergies.   Social History   Socioeconomic History   Marital status: Married    Spouse name: Not on file   Number of children: Not on file   Years of education: Not on file   Highest education level: Not on file  Occupational History   Not on file  Tobacco Use   Smoking status: Former    Current packs/day: 0.00    Average packs/day: 3.0 packs/day for 40.0 years (120.0 ttl pk-yrs)    Types: Cigarettes    Start date: 77    Quit date: 16    Years since quitting: 34.9   Smokeless tobacco: Never  Vaping Use   Vaping status: Never Used   Substance and Sexual Activity  Alcohol use: Yes    Comment: Social    Drug use: Never   Sexual activity: Not on file  Other Topics Concern   Not on file  Social History Narrative   Not on file   Social Drivers of Health   Financial Resource Strain: Not on file  Food Insecurity: Not on file  Transportation Needs: Not on file  Physical Activity: Not on file  Stress: Not on file  Social Connections: Not on file     Family History: The patient's family history includes COPD in his brother; Heart attack in his mother; High blood pressure in his father and mother; Stroke in his father. ROS:   Please see the history of present illness.    All 14 point review of systems negative except as described per history of present illness.  EKGs/Labs/Other Studies Reviewed:    The following studies were reviewed today:                                  Transthoracic Echocardiogram Report  Name  GUIA, QUINTARUS CRAVENS                          Study Date  07-05-2022           Height  72 in  MRN  [id number]                                       Patient Location  [facility]      Weight  229 lb  DOB  03/18/1936                                     Gender  Male                     BSA  2.3 m2  Age  12 yrs                                         Ethnicity  1                     BP  124-70 mmHg  Reason For Study  arrhythmia                                                         HR  72  History  HTN, pulm HTN, CAD, GERD, HLD, Former tobacco use.  Ordering Physician  Maurie Boettcher THOMPSON     Performed By  Jarome Matin  Referring Physician  MITCHELL, MEREDITH THOMPSON  -  -  PROCEDURE  A two-dimensional transthoracic echocardiogram with color flow, Doppler and global longitudinal  strain imaging was performed.  -  SUMMARY  The left ventricular size is normal.  There is moderate eccentric left ventricular hypertrophy.  Left ventricular systolic function is normal.  LV ejection fraction =  65-70%.  LV Global L Strain =-14.6%.  No segmental  wall motion abnormalities seen in the left ventricle  The right ventricle is normal in size and function.  The left atrium is moderately dilated.  The right atrium is mildly dilated.  There is mild tricuspid regurgitation.  No pulmonary hypertension.  Trace to mild pulmonic valvular regurgitation.  The aortic sinus is normal size.  IVC size was normal.  There is no pericardial effusion.  Compared to prior study, RVSP has decreased.  -  Cardiac Diagnostic + Interventional Report   Demographics   Patient     AKEEM GETTER Date of     1935/12/22  Height         72 inches   Name        H            Birth   Patient     [id number]      Age         44 year s   Weight         231.5 pounds   Number   Visit       [id number]  Gender      Male        BSA            2.27 m2   Number   Accession   [id number]    Race        Unknown     BMI            31.35 kg-m2   Number                            Room Number [facility]    Date of Study  03-08-2019   Referring   Wende Bushy, Diagnostic  [facility]        Interventional Wende Bushy,   Physician   MD           Physician   Allena Katz, MD  Physician      MD  Procedure  Procedure Type   Diagnostic procedure  CARDIAC CATH   PCI procedure  Complications  No Complications.  Conclusions  Diagnostic Summary  PROCEDURES PERFORMED  Left heart cath  Coronary angiography  PCI with DES to RCA  FINDINGS  Severe single vessel CAD  95% mid RCA lesion  Normal LVEDP  Diagnostic Recommendations  Proceed with PCI to RCA  Interventional Summary  Successful PCI with DES to RCA  Interventional Recommendations  Loaded with Brilinta in the lab.  DAPT for 12 months Due to his age, consider de-escalation to Plavix in 3  months   Signatures   Electronically signed by Wende Bushy, MD Interventional   Physician  on 03-08-2019 10 58   EKG:  EKG Interpretation Date/Time:  Monday April 03 2023 17:21:44 EST Ventricular  Rate:  69 PR Interval:    QRS Duration:  94 QT Interval:  428 QTC Calculation: 458 R Axis:   58  Text Interpretation: Atrial fibrillation Abnormal QRS-T angle, consider primary T wave abnormality Abnormal ECG When compared with ECG of 24-Feb-2023 09:37, Nonspecific T wave abnormality now evident in Inferior leads Confirmed by Huntley Dec reddy [phone]) on 04/03/2023 4:27:54 PM    Recent Labs: No results found for requested labs within last 365 days.  Recent Lipid Panel No results found for: "CHOL", "TRIG", "HDL", "CHOLHDL", "VLDL", "LDLCALC", "LDLDIRECT"  Physical Exam:    VS:  BP (!) 142/56   Pulse 67   Ht 6' (1.829  m)   Wt 232 lb 9.6 oz (105.5 kg)   SpO2 95%   BMI 31.55 kg/m     Wt Readings from Last 3 Encounters:  04/03/23 232 lb 9.6 oz (105.5 kg)  02/24/23 234 lb 12.8 oz (106.5 kg)  09/07/20 230 lb 12.8 oz (104.7 kg)     GENERAL:  Well nourished, well developed in no acute distress NECK: No JVD; No carotid bruits CARDIAC: Irregularly irregular, S1 and S2 present,  CHEST:  Clear to auscultation without rales, wheezing or rhonchi  Extremities: 2+ bilateral pitting pedal edema extending up to the knees.  NEUROLOGIC:  Alert and oriented x 3  Medication Adjustments/Labs and Tests Ordered: Current medicines are reviewed at length with the patient today.  Concerns regarding medicines are outlined above.  Orders Placed This Encounter  Procedures   Basic metabolic panel   Pro b natriuretic peptide (BNP)   EKG 12-Lead   No orders of the defined types were placed in this encounter.   Signed, Cecille Amsterdam, MD, MPH, [facility]. 04/03/2023 4:28 PM    [facility]

## 2023-04-03 NOTE — Assessment & Plan Note (Signed)
Suboptimal at this time.  Given the elderly age tried target below 140 over 80 mmHg. Continue current medications amlodipine, carvedilol, candesartan/hydrochlorothiazide combination medication.

## 2023-04-03 NOTE — Assessment & Plan Note (Addendum)
Persistent A-fib.  Remains in A-fib by EKG today Rate controlled. Continue Eliquis 5 mg twice daily. Reassured that Eliquis is unlikely to be contributing to his swelling in the legs.  CHADS2 VASc score 4.

## 2023-04-03 NOTE — Patient Instructions (Addendum)
Medication Instructions:  Your physician recommends that you continue on your current medications as directed. Please refer to the Current Medication list given to you today.  Take an extra dose of your Lasix 40 mg in the evening for the next 5 days.  *If you need a refill on your cardiac medications before your next appointment, please call your pharmacy*   Lab Work: Your physician recommends that you return for lab work: 04/13/23 fpr BMP and ProBNP. You can come Monday through Friday 8:30 am to 12:00 pm and 1:15 to 4:30. You do not need to make an appointment as the order has already been placed.  If you have labs (blood work) drawn today and your tests are completely normal, you will receive your results only by: MyChart Message (if you have MyChart) OR A paper copy in the mail If you have any lab test that is abnormal or we need to change your treatment, we will call you to review the results.   Testing/Procedures: None ordered   Follow-Up: At [facility], you and your health needs are our priority.  As part of our continuing mission to provide you with exceptional heart care, we have created designated Provider Care Teams.  These Care Teams include your primary Cardiologist (physician) and Advanced Practice Providers (APPs -  Physician Assistants and Nurse Practitioners) who all work together to provide you with the care you need, when you need it.  We recommend signing up for the patient portal called "MyChart".  Sign up information is provided on this After Visit Summary.  MyChart is used to connect with patients for Virtual Visits (Telemedicine).  Patients are able to view lab/test results, encounter notes, upcoming appointments, etc.  Non-urgent messages can be sent to your provider as well.   To learn more about what you can do with MyChart, go to [email or web address].    Your next appointment:   2 week(s)  The format for your next appointment:   In  Person  Provider:   Huntley Dec, MD    Other Instructions none  Important Information About Sugar

## 2023-04-03 NOTE — Assessment & Plan Note (Signed)
Doing well.  No anginal symptoms. CCS class II.  Walks up to a mile a day.  Aspirin discontinued after he was started on Eliquis. Continue with amlodipine, carvedilol at current doses 10 mg once a day and 12.5 mg twice respectively.

## 2023-04-03 NOTE — Assessment & Plan Note (Signed)
Elevated BN P 248. Hypervolemic with bilateral lower extremity edema 2+ pitting type.  Continue furosemide 40 mg once a day in the morning. Additional furosemide 40 mg in the afternoons recommended to be taken for the next 5 days.  Repeat blood work early next week for BMP, proBNP.

## 2023-04-06 ENCOUNTER — Ambulatory Visit: Payer: Medicare Other | Admitting: Cardiology

## 2023-04-10 DIAGNOSIS — R600 Localized edema: Secondary | ICD-10-CM | POA: Diagnosis not present

## 2023-04-10 DIAGNOSIS — I10 Essential (primary) hypertension: Secondary | ICD-10-CM | POA: Diagnosis not present

## 2023-04-10 DIAGNOSIS — I2510 Atherosclerotic heart disease of native coronary artery without angina pectoris: Secondary | ICD-10-CM | POA: Diagnosis not present

## 2023-04-11 LAB — BASIC METABOLIC PANEL
BUN/Creatinine Ratio: 19 (ref 10–24)
BUN: 26 mg/dL (ref 8–27)
CO2: 23 mmol/L (ref 20–29)
Calcium: 8.6 mg/dL (ref 8.6–10.2)
Chloride: 101 mmol/L (ref 96–106)
Creatinine, Ser: 1.38 mg/dL — ABNORMAL HIGH (ref 0.76–1.27)
Glucose: 141 mg/dL — ABNORMAL HIGH (ref 70–99)
Potassium: 4.1 mmol/L (ref 3.5–5.2)
Sodium: 140 mmol/L (ref 134–144)
eGFR: 50 mL/min/{1.73_m2} — ABNORMAL LOW (ref >59)

## 2023-04-11 LAB — PRO B NATRIURETIC PEPTIDE: NT-Pro BNP: 1864 pg/mL — ABNORMAL HIGH (ref 0–486)

## 2023-04-14 ENCOUNTER — Ambulatory Visit: Payer: Medicare Other

## 2023-04-14 VITALS — BP 118/62 | HR 57 | Ht 72.0 in | Wt 226.2 lb

## 2023-04-14 DIAGNOSIS — I5043 Acute on chronic combined systolic (congestive) and diastolic (congestive) heart failure: Secondary | ICD-10-CM | POA: Diagnosis not present

## 2023-04-14 DIAGNOSIS — I4891 Unspecified atrial fibrillation: Secondary | ICD-10-CM | POA: Diagnosis not present

## 2023-04-14 MED ORDER — FUROSEMIDE 40 MG PO TABS: 40.0000 mg | ORAL_TABLET | ORAL | 3 refills | Status: DC

## 2023-04-14 NOTE — Assessment & Plan Note (Signed)
Persistent A-fib, rate controlled. Remains on Eliquis 5 mg twice daily.  CHADS2 Vascor 4.  Cardioversion alone may not be sufficient at this time for him and he would need either antiarrhythmics versus ablation to help his rhythm control success.    Continue with rate control strategy for now. Subsequent follow-up depending on his renal function, the symptoms will discuss further rhythm control options.

## 2023-04-14 NOTE — Patient Instructions (Signed)
Medication Instructions:    Start taking Lasix 40 mg every other day in the afternoon in addition to 40 mg every morning.  *If you need a refill on your cardiac medications before your next appointment, please call your pharmacy*   Lab Work: None Ordered If you have labs (blood work) drawn today and your tests are completely normal, you will receive your results only by: MyChart Message (if you have MyChart) OR A paper copy in the mail If you have any lab test that is abnormal or we need to change your treatment, we will call you to review the results.   Testing/Procedures: None Ordered   Follow-Up: At [facility], you and your health needs are our priority.  As part of our continuing mission to provide you with exceptional heart care, we have created designated Provider Care Teams.  These Care Teams include your primary Cardiologist (physician) and Advanced Practice Providers (APPs -  Physician Assistants and Nurse Practitioners) who all work together to provide you with the care you need, when you need it.  We recommend signing up for the patient portal called "MyChart".  Sign up information is provided on this After Visit Summary.  MyChart is used to connect with patients for Virtual Visits (Telemedicine).  Patients are able to view lab/test results, encounter notes, upcoming appointments, etc.  Non-urgent messages can be sent to your provider as well.   To learn more about what you can do with MyChart, go to [email or web address].    Your next appointment:   3 month follow up

## 2023-04-14 NOTE — Progress Notes (Signed)
Cardiology Consultation:    Date:  04/14/2023   ID:  [name], DOB [birth year], MRN [id number]  PCP:  Lise Auer, MD  Cardiologist:  Marlyn Corporal Chazlyn Cude, MD   Referring MD: Lise Auer, MD   No chief complaint on file.    ASSESSMENT AND PLAN:   [name] 87 year old male patient with history of CAD single-vessel disease s/p PCI of RCA in November 2020, last stress test February 2024 without significant ischemia, pulmonary hypertension on prior echocardiogram in November 2020 but no significant pulmonary pressures on last echocardiogram March 2024, persistent atrial fibrillation diagnosed 02/21/2023) of health [facility], hypertension, hyperlipidemia, CKD stage III, former smoker, former alcohol abuse, chronic CHF with preserved EF here for follow-up.  Problem List Items Addressed This Visit     Atrial fibrillation with controlled ventricular response (HCC), persistent, last Zio patch March 2024 reportedly predominantly sinus rhythm   Persistent A-fib, rate controlled. Remains on Eliquis 5 mg twice daily.  CHADS2 Vascor 4.  Cardioversion alone may not be sufficient at this time for him and he would need either antiarrhythmics versus ablation to help his rhythm control success.    Continue with rate control strategy for now. Subsequent follow-up depending on his renal function, the symptoms will discuss further rhythm control options.      Relevant Medications   furosemide (LASIX) 40 MG tablet   Acute on chronic combined systolic and diastolic CHF (congestive heart failure) (HCC) - Primary   Significantly improved pedal edema and weight down to 226 pounds today in comparison to 232 pounds at last visit.  Labs showed stable renal function.  Continue with salt restriction to below 2 g/day and 2 L/day fluid restriction. Continue daily weight monitoring and extra dose of Lasix on the days where weight goes up by 2 to 3 pounds in  a day or 5 pounds in a week.  Continue  furosemide 40 mg once in the morning every day and an additional 40 mg once in the afternoon every other day.  Will follow-up lab work at subsequent visits and depending on renal function and his symptoms we will consider adding SGLT2 inhibitors.        Relevant Medications   furosemide (LASIX) 40 MG tablet   Return to clinic in 3 months or as needed.   History of Present Illness:    Ian Diaz is a 87 y.o. male who is being seen today for follow-up. Recent visit with [place] in the office was 04/03/2023. PCP is Lise Auer, MD.    Brief review of history from prior visits: CAD with history of single-vessel disease s/p PCI of RCA in November 2020 at [facility]; last stress test with [facility] February 2024 at [facility] health showed no ischemia; Pulmonary hypertension by echocardiogram RVSP 50 mmHg on echocardiogram normal 2020 repeat echocardiogram March 2024 no significant play elevated RVSP.  Persistent atrial fibrillation recently diagnosed [facility], CKD stage III, hypertension, hyperlipidemia, former smoker [quit in 1990, smoked up to 3 packs a day for 40 years], former alcohol abuse [quit in 2020; drinking up to a gallon a day prior to that], food impaction requiring esophageal dilatation in the past [recent dilatation 03/02/2023].  With increased shortness of breath and pedal edema and weight he came in for an earlier visit on 04/03/2023. we bumped up his furosemide regimen for 5 days and was supposed to follow-up with blood work.  NT proBNP 1864 on 04/10/2023. Basic metabolic panel shows BUN 26, creatinine  1.38, EGFR 50 Sodium 140, potassium 4.1 In comparison to prior labs from 03/29/2023 at PCPs office noted BNP 248, BUN 28, creatinine 1.3, EGFR 53.  Here today for visit accompanied by his wife. His breathing is improved.  However he continues to feel short of breath with mild to moderate exertion at home.  Mentions bilateral lower extremity edema has improved.   Weight has improved. Weight at last clinic visit was 232 pounds today down to 226 pounds.  Has cut down salt intake. Good compliance with medications. Is pending follow-up visit with PCP and is planning to review further evaluation for COPD.   Past Medical History:  Diagnosis Date   Acid reflux    Acute on chronic combined systolic and diastolic CHF (congestive heart failure) (HCC) 04/03/2023   Atrial fibrillation with controlled ventricular response (HCC) 06/07/2022   Chest pain 03/07/2019   Chronic coronary artery disease 03/09/2019   LHC 03/07/2019 - 1 vessel CAD severe RCA stenosis s/p PCI/DES.     Diverticulosis    DOE (dyspnea on exertion) 10/07/2019   Onset 04/2019  p MI / stent 02/2019  - assoc with cough resolved off acei   - Baseline Spirometry 04/19/2018  FEV1 2.6 (83%)  Ratio 0.77 min concavity  To f/v curve   Echo 09/27/19 The left ventricular size is normal.    There is mild concentric left ventricular hypertrophy.    LV ejection fraction = 60-65%.    The left ventricular wall motion is normal.    The left atrium is mildly dilated.    There    Dysphagia    Esophageal reflux disease    Esophageal stricture    Former smoker 03/19/2019   Heart palpitations 06/07/2022   History of anemia    History of colon polyps    History of heart attack    History of tobacco abuse    Hypertension    Hypertension, benign    Iron deficiency anemia    Long-term use of aspirin therapy 03/12/2019   Mixed hyperlipidemia 03/12/2019   NSTEMI (non-ST elevated myocardial infarction) (HCC) 03/07/2019   Added automatically from request for surgery [id number]  [facility] 03/07/2019 sever RCA stenosis s/p DES.     Pulmonary hypertension (HCC) 03/12/2019   Rheumatic fever childhood   Solitary pulmonary nodule on lung CT 05/10/2018   Chest CT 02/28/18  1) enchondroma R lat 9th rib stable since 2009   2) 9 mm RLL well circ rec 6-12 m f/u > CT 03/05/2019  slt decrease > no directed f/u needed         Swelling of  right lower extremity 03/07/2019   Upper airway cough syndrome 04/19/2018   Onset was summer 2018     Prior GI eval 08/2017 egd c/w HH and was dilated empirically   - Spirometry 04/19/2018  FEV1 2.6 (83%)  Ratio 73 s curvature  s prior rx   - FENO 04/19/2018  =   30   - Allergy profile 04/19/2018 >  Eos 0.4 /  IgE  206 RAST neg   - Dg Es 04/26/2018 1. Small type 1 hiatal hernia.  2. Mild smooth narrowing of the distal esophagus, without  irregularity, maximum distended diameter bo    Past Surgical History:  Procedure Laterality Date   ADENOIDECTOMY     BREAST SURGERY     Tumor   HERNIA REPAIR     SKIN CANCER EXCISION     STENT PLACEMENT VASCULAR ([facility] HX)  TONSILLECTOMY      Current Medications: Current Meds  Medication Sig   amLODipine (NORVASC) 10 MG tablet Take 10 mg by mouth daily.   apixaban (ELIQUIS) 5 MG TABS tablet Take 1 tablet (5 mg total) by mouth 2 (two) times daily.   atorvastatin (LIPITOR) 80 MG tablet Take 1 tablet by mouth daily.   candesartan-hydrochlorothiazide (ATACAND HCT) 16-12.5 MG tablet Take 1 tablet by mouth daily.   carvedilol (COREG) 12.5 MG tablet Take 12.5 mg by mouth 2 (two) times daily with a meal.   famotidine (PEPCID) 20 MG tablet Take 1 tablet by mouth at bedtime.   ferrous sulfate 325 (65 FE) MG tablet Take 1 tablet by mouth daily.   furosemide (LASIX) 40 MG tablet Take 40 mg by mouth daily as needed.   furosemide (LASIX) 40 MG tablet Take 1 tablet (40 mg total) by mouth every other day. Take 40 mg every morning, and take an additional 40 mg every other afternoon   montelukast (SINGULAIR) 10 MG tablet Take 1 tablet by mouth at bedtime.   Multiple Vitamins-Minerals (CENTRUM SILVER PO) Take 1 tablet by mouth daily. 0.4 mg-300 mcg-250 mcg   omeprazole (PRILOSEC) 40 MG capsule Take 40 mg by mouth 2 (two) times daily.   Potassium Chloride ER 20 MEQ TBCR Take 1 tablet by mouth daily.     Allergies:   Patient has no known allergies.   Social History    Socioeconomic History   Marital status: Married    Spouse name: Not on file   Number of children: Not on file   Years of education: Not on file   Highest education level: Not on file  Occupational History   Not on file  Tobacco Use   Smoking status: Former    Current packs/day: 0.00    Average packs/day: 3.0 packs/day for 40.0 years (120.0 ttl pk-yrs)    Types: Cigarettes    Start date: 26    Quit date: 81    Years since quitting: 35.0   Smokeless tobacco: Never  Vaping Use   Vaping status: Never Used  Substance and Sexual Activity   Alcohol use: Yes    Comment: Social    Drug use: Never   Sexual activity: Not on file  Other Topics Concern   Not on file  Social History Narrative   Not on file   Social Drivers of Health   Financial Resource Strain: Not on file  Food Insecurity: Not on file  Transportation Needs: Not on file  Physical Activity: Not on file  Stress: Not on file  Social Connections: Not on file     Family History: The patient's family history includes COPD in his brother; Heart attack in his mother; High blood pressure in his father and mother; Stroke in his father. ROS:   Please see the history of present illness.    All 14 point review of systems negative except as described per history of present illness.  EKGs/Labs/Other Studies Reviewed:    The following studies were reviewed today:   EKG:       Recent Labs: 04/10/2023: BUN 26; Creatinine, Ser 1.38; NT-Pro BNP 1,864; Potassium 4.1; Sodium 140  Recent Lipid Panel No results found for: "CHOL", "TRIG", "HDL", "CHOLHDL", "VLDL", "LDLCALC", "LDLDIRECT"  Physical Exam:    VS:  BP 118/62   Pulse (!) 57   Ht 6' (1.829 m)   Wt 226 lb 3.2 oz (102.6 kg)   SpO2 98%   BMI 30.68 kg/m  Wt Readings from Last 3 Encounters:  04/14/23 226 lb 3.2 oz (102.6 kg)  04/03/23 232 lb 9.6 oz (105.5 kg)  02/24/23 234 lb 12.8 oz (106.5 kg)     GENERAL:  Well nourished, well developed in no acute  distress NECK: No JVD; No carotid bruits CARDIAC: RRR, S1 and S2 present, no murmurs, no rubs, no gallops CHEST:  Clear to auscultation without rales, wheezing or rhonchi  Extremities: 1+ bilateral pitting pedal edema extending up to the knees. Pulses bilaterally symmetric with radial 2+ and dorsalis pedis 2+ NEUROLOGIC:  Alert and oriented x 3  Medication Adjustments/Labs and Tests Ordered: Current medicines are reviewed at length with the patient today.  Concerns regarding medicines are outlined above.  No orders of the defined types were placed in this encounter.  Meds ordered this encounter  Medications   furosemide (LASIX) 40 MG tablet    Sig: Take 1 tablet (40 mg total) by mouth every other day. Take 40 mg every morning, and take an additional 40 mg every other afternoon    Dispense:  45 tablet    Refill:  3    Signed, Filomena Pokorney reddy Amyria Komar, MD, MPH, [facility]. 04/14/2023 3:24 PM    [facility]

## 2023-04-14 NOTE — Assessment & Plan Note (Signed)
Significantly improved pedal edema and weight down to 226 pounds today in comparison to 232 pounds at last visit.  Labs showed stable renal function.  Continue with salt restriction to below 2 g/day and 2 L/day fluid restriction. Continue daily weight monitoring and extra dose of Lasix on the days where weight goes up by 2 to 3 pounds in  a day or 5 pounds in a week.  Continue furosemide 40 mg once in the morning every day and an additional 40 mg once in the afternoon every other day.  Will follow-up lab work at subsequent visits and depending on renal function and his symptoms we will consider adding SGLT2 inhibitors.

## 2023-04-17 DIAGNOSIS — J439 Emphysema, unspecified: Secondary | ICD-10-CM | POA: Diagnosis not present

## 2023-04-17 DIAGNOSIS — I4891 Unspecified atrial fibrillation: Secondary | ICD-10-CM | POA: Diagnosis not present

## 2023-05-01 ENCOUNTER — Other Ambulatory Visit: Payer: Self-pay

## 2023-05-02 ENCOUNTER — Ambulatory Visit: Payer: Medicare Other

## 2023-05-02 VITALS — BP 113/60 | HR 70 | Ht 72.0 in | Wt 221.0 lb

## 2023-05-02 DIAGNOSIS — I10 Essential (primary) hypertension: Secondary | ICD-10-CM

## 2023-05-02 DIAGNOSIS — I4891 Unspecified atrial fibrillation: Secondary | ICD-10-CM

## 2023-05-02 DIAGNOSIS — I5043 Acute on chronic combined systolic (congestive) and diastolic (congestive) heart failure: Secondary | ICD-10-CM

## 2023-05-02 MED ORDER — AMLODIPINE BESYLATE 5 MG PO TABS: 5.0000 mg | ORAL_TABLET | Freq: Every day | ORAL | 3 refills | Status: DC

## 2023-05-02 NOTE — Assessment & Plan Note (Signed)
 CHA2DS2-VASc score of 4 On anticoagulation with Eliquis  5 mg twice daily.  Rates well-controlled. We discussed about rhythm control with cardioversion and probably antiarrhythmic requirement to keep him in sinus rhythm.  However given his reported history of black stools over the last couple days without any ongoing GI symptoms, will hold off on cardioversion plan for now.  Will obtain a CBC today to assess for any significant hemoglobin and hematocrit changes. Advised him to monitor his GI symptoms and if he continues to have black stools over the next 2 to 3 days, should further review with his PCP for follow-up CBC and if further GI workup is needed.  Will have him come back for follow-up visit in 1 month tentatively and revisit rhythm control timing. Briefly discussed TEE cardioversion and antiarrhythmics and he is agreeable to this if recommended. Will finalize a plan at next follow-up visit.

## 2023-05-02 NOTE — Addendum Note (Signed)
 Addended by: Lonia Farber on: 05/02/2023 12:03 PM   Modules accepted: Orders

## 2023-05-02 NOTE — Assessment & Plan Note (Signed)
 Optimize fluid status. Weight further decreased and down to 221 pounds today at office visit. Last visit he was weighing 226 pounds and prior to that was 232 pounds.  Mentions mild lightheadedness and dizziness with positional changes notably.  Blood pressures relatively low measuring 113/60 mmHg standing and 109/67 mmHg sitting. Not orthostatic  Continue furosemide  40 mg once daily in the morning and 40 mg in the afternoon every other day Continue with salt restriction below 2 g/day. Fluid restriction to below 2 L/day.  Daily weight monitoring and if weight gain more than 2 to 3 pounds within a day or 5 pounds within a week she will let us  know.  Will cut down the dose of amlodipine  to 5 mg once daily given soft blood pressures and symptoms of dizziness and lightheadedness with positional changes. Continue candesartan-hydrochlorothiazide 16 mg - 12.5 mg once daily dose and carvedilol  12.5 mg twice daily dose.

## 2023-05-02 NOTE — Patient Instructions (Addendum)
 Medication Instructions:    Decrease your Amlodipine  to 5 mg once a day.  *If you need a refill on your cardiac medications before your next appointment, please call your pharmacy*   Lab Work: Your physician recommends that you have labs done in the office today. Your test included  complete metabolic panel, complete blood count, and Pro BNP.  If you have labs (blood work) drawn today and your tests are completely normal, you will receive your results only by: MyChart Message (if you have MyChart) OR A paper copy in the mail If you have any lab test that is abnormal or we need to change your treatment, we will call you to review the results.   Testing/Procedures: None Ordered   Follow-Up: At [facility], you and your health needs are our priority.  As part of our continuing mission to provide you with exceptional heart care, we have created designated Provider Care Teams.  These Care Teams include your primary Cardiologist (physician) and Advanced Practice Providers (APPs -  Physician Assistants and Nurse Practitioners) who all work together to provide you with the care you need, when you need it.  We recommend signing up for the patient portal called MyChart.  Sign up information is provided on this After Visit Summary.  MyChart is used to connect with patients for Virtual Visits (Telemedicine).  Patients are able to view lab/test results, encounter notes, upcoming appointments, etc.  Non-urgent messages can be sent to your provider as well.   To learn more about what you can do with MyChart, go to [email or web address].    Your next appointment:   1 month follow up

## 2023-05-02 NOTE — Progress Notes (Addendum)
 Cardiology Consultation:    Date:  05/02/2023   ID:  Ian Diaz, DOB Sep 15, 1935, MRN [id number]  PCP:  Fernand Tracey LABOR, MD  Cardiologist:  Ian SAUNDERS Ian Drolet, MD   Referring MD: Fernand Tracey LABOR, MD   No chief complaint on file.    ASSESSMENT AND PLAN:   Ian Diaz 88 year old male with history of chronic heart failure with preserved ejection fraction, CAD single-vessel disease s/p PCI of RCA in November 2020, last stress test February 2024 without ischemia, pulmonary hypertension on prior echocardiogram November 2020 but normal pressures on last echocardiogram March 2024, persistent atrial fibrillation [diagnosed November 2024], hypertension, hyperlipidemia, here for follow-up for heart failure and A-fib management reports lightheadedness and black stools over the last couple days.  Problem List Items Addressed This Visit     Hypertension - Primary   Relevant Medications   amLODipine  (NORVASC ) 5 MG tablet   Other Relevant Orders   EKG 12-Lead (Completed)   Comprehensive metabolic panel   CBC   Pro b natriuretic peptide (BNP)   Atrial fibrillation with controlled ventricular response (HCC), persistent, last Zio patch March 2024 reportedly predominantly sinus rhythm   CHA2DS2-VASc score of 4 On anticoagulation with Eliquis  5 mg twice daily.  Rates well-controlled. We discussed about rhythm control with cardioversion and probably antiarrhythmic requirement to keep him in sinus rhythm.  However given his reported history of black stools over the last couple days without any ongoing GI symptoms, will hold off on cardioversion plan for now.  Will obtain a CBC today to assess for any significant hemoglobin and hematocrit changes. Advised him to monitor his GI symptoms and if he continues to have black stools over the next 2 to 3 days, should further review with his PCP for follow-up CBC and if further GI workup is needed.  Will have him come back for follow-up visit in 1 month  tentatively and revisit rhythm control timing. Briefly discussed TEE cardioversion and antiarrhythmics and he is agreeable to this if recommended. Will finalize a plan at next follow-up visit.       Relevant Medications   amLODipine  (NORVASC ) 5 MG tablet   Acute on chronic combined systolic and diastolic CHF (congestive heart failure) (HCC)   Optimize fluid status. Weight further decreased and down to 221 pounds today at office visit. Last visit he was weighing 226 pounds and prior to that was 232 pounds.  Mentions mild lightheadedness and dizziness with positional changes notably.  Blood pressures relatively low measuring 113/60 mmHg standing and 109/67 mmHg sitting. Not orthostatic  Continue furosemide  40 mg once daily in the morning and 40 mg in the afternoon every other day Continue with salt restriction below 2 g/day. Fluid restriction to below 2 L/day.  Daily weight monitoring and if weight gain more than 2 to 3 pounds within a day or 5 pounds within a week she will let us  know.  Will cut down the dose of amlodipine  to 5 mg once daily given soft blood pressures and symptoms of dizziness and lightheadedness with positional changes. Continue candesartan-hydrochlorothiazide 16 mg - 12.5 mg once daily dose and carvedilol  12.5 mg twice daily dose.       Relevant Medications   amLODipine  (NORVASC ) 5 MG tablet    Return to clinic in 1 month.  Addendum 05/04/2023: Ian Diaz with history of A-fib, on anticoagulation with Eliquis , history of esophageal stricture s/p dilatation on 03/02/2023.  Seen in the office by me on 05-02-2023, he was reporting black stools  for couple days. We obtained CBC that shows hemoglobin dropped to 8.2 on 05-03-2023. In comparison hemoglobin was 13.6 on 02-21-2023 at [facility]  We suspended Eliquis  and had him go to the ER at [facility] on 05-03-2023, I reviewed the [facility] ER notes from yesterday and he received a unit of  PRBC transfusion and, fecal occult blood was positive. ER provider after discussing with patient and based on patient's preference, lack of GI service for on-call this week he eventually went home.  I am having my office contact Ian Diaz and see how his symptoms are. - Discontinue the Eliquis , given risk of ongoing GI bleed. - Please have him follow-up with gastroenterologist ASAP. - Also forwarding this to their PCP Dr. Fernand Seen at [facility].  History of Present Illness:    Ian Diaz is a 88 y.o. male who is being seen today for follow-up visit. PCP is Fernand Seen LABOR, MD.  Last visit in the office was 04/14/2023  Has history of CAD single-vessel disease s/p PCI of RCA in November 2020, last stress test February 2024 without ischemia, pulmonary hypertension on prior echocardiogram November 2020 but normal pressures on last echocardiogram March 2024, persistent atrial fibrillation [diagnosed November 2024], hypertension, hyperlipidemia, CKD stage III, former smoker, former alcohol abuse, chronic CHF with preserved LVEF.  Pleasant man here for the visit accompanied by his wife.  Weight was 226 pounds at last office visit 3 weeks ago Weight today 221 pounds.  [Home scale he mentions weight is around 219 -220 pounds] Remains on furosemide  40 mg daily once in the morning and 40 mg every other day in the afternoon.  Mentions he has been feeling lightheaded and dizzy with positional changes over the last few days.  Denies any syncopal episodes or falls. Also mentions over the last 2 days he has noticed black stools 1-2 times a day.  He routinely has 2 bowel movements.  Wonders if this is related to the kinds of foods that he has been eating.  Denies any frank blood in urine.  He mentions overall he feels tired and frustrated with the symptoms of feeling lightheaded.  Is been diligent with his medications. Has cut down salt intake.  Lab work from 04/10/2023 reported NT proBNP 1864 BUN 26,  creatinine 1.38, EGFR 50.  Normal sodium and potassium levels Last CBC available to review is from 02/21/2023 at [facility] hemoglobin 13.6, hematocrit 40.3, platelets 157, WBC 5.1.  EKG in the clinic today shows atrial fibrillation, rate controlled 66/min, significant baseline artifact.  In comparison to prior EKG from April 03, 2023 no significant change  Past Medical History:  Diagnosis Date   Acid reflux    Acute on chronic combined systolic and diastolic CHF (congestive heart failure) (HCC) 04/03/2023   Atrial fibrillation with controlled ventricular response (HCC) 06/07/2022   Chest pain 03/07/2019   Chronic coronary artery disease 03/09/2019   LHC 03/07/2019 - 1 vessel CAD severe RCA stenosis s/p PCI/DES.     Diverticulosis    DOE (dyspnea on exertion) 10/07/2019   Onset 04/2019  p MI / stent 02/2019  - assoc with cough resolved off acei   - Baseline Spirometry 04/19/2018  FEV1 2.6 (83%)  Ratio 0.77 min concavity  To f/v curve   Echo 09/27/19 The left ventricular size is normal.    There is mild concentric left ventricular hypertrophy.    LV ejection fraction = 60-65%.    The left ventricular wall motion is  normal.    The left atrium is mildly dilated.    There    Dysphagia    Esophageal reflux disease    Esophageal stricture    Former smoker 03/19/2019   Heart palpitations 06/07/2022   History of anemia    History of colon polyps    History of heart attack    History of tobacco abuse    Hypertension    Hypertension, benign    Iron deficiency anemia    Long-term use of aspirin therapy 03/12/2019   Mixed hyperlipidemia 03/12/2019   NSTEMI (non-ST elevated myocardial infarction) (HCC) 03/07/2019   Added automatically from request for surgery [id number]  [facility] 03/07/2019 sever RCA stenosis s/p DES.     Pulmonary hypertension (HCC) 03/12/2019   Rheumatic fever childhood   Solitary pulmonary nodule on lung CT 05/10/2018   Chest CT 02/28/18  1) enchondroma R lat 9th rib  stable since 2009   2) 9 mm RLL well circ rec 6-12 m f/u > CT 03/05/2019  slt decrease > no directed f/u needed         Swelling of right lower extremity 03/07/2019   Upper airway cough syndrome 04/19/2018   Onset was summer 2018     Prior GI eval 08/2017 egd c/w HH and was dilated empirically   - Spirometry 04/19/2018  FEV1 2.6 (83%)  Ratio 73 s curvature  s prior rx   - FENO 04/19/2018  =   30   - Allergy  profile 04/19/2018 >  Eos 0.4 /  IgE  206 RAST neg   - Dg Es 04/26/2018 1. Small type 1 hiatal hernia.  2. Mild smooth narrowing of the distal esophagus, without  irregularity, maximum distended diameter bo    Past Surgical History:  Procedure Laterality Date   ADENOIDECTOMY     BREAST SURGERY     Tumor   HERNIA REPAIR     SKIN CANCER EXCISION     STENT PLACEMENT VASCULAR ([facility] HX)     TONSILLECTOMY      Current Medications: Current Meds  Medication Sig   amLODipine  (NORVASC ) 5 MG tablet Take 1 tablet (5 mg total) by mouth daily.   apixaban  (ELIQUIS ) 5 MG TABS tablet Take 1 tablet (5 mg total) by mouth 2 (two) times daily.   atorvastatin (LIPITOR) 80 MG tablet Take 1 tablet by mouth daily.   candesartan-hydrochlorothiazide (ATACAND HCT) 16-12.5 MG tablet Take 1 tablet by mouth daily.   carvedilol  (COREG ) 12.5 MG tablet Take 12.5 mg by mouth 2 (two) times daily with a meal.   famotidine  (PEPCID ) 20 MG tablet Take 1 tablet by mouth at bedtime.   ferrous sulfate 325 (65 FE) MG tablet Take 1 tablet by mouth daily.   furosemide  (LASIX ) 40 MG tablet Take 1 tablet (40 mg total) by mouth every other day. Take 40 mg every morning, and take an additional 40 mg every other afternoon   montelukast  (SINGULAIR ) 10 MG tablet Take 1 tablet by mouth at bedtime.   Multiple Vitamins-Minerals (CENTRUM SILVER PO) Take 1 tablet by mouth daily. 0.4 mg-300 mcg-250 mcg   nitroGLYCERIN (NITROSTAT) 0.4 MG SL tablet Place 0.4 mg under the tongue every 5 (five) minutes as needed for chest pain.   omeprazole  (PRILOSEC)  40 MG capsule Take 40 mg by mouth 2 (two) times daily.   Potassium Chloride ER 20 MEQ TBCR Take 1 tablet by mouth daily.   [DISCONTINUED] amLODipine  (NORVASC ) 10 MG tablet Take 10 mg by mouth daily.  Allergies:   Patient has no known allergies.   Social History   Socioeconomic History   Marital status: Married    Spouse name: Not on file   Number of children: Not on file   Years of education: Not on file   Highest education level: Not on file  Occupational History   Not on file  Tobacco Use   Smoking status: Former    Current packs/day: 0.00    Average packs/day: 3.0 packs/day for 40.0 years (120.0 ttl pk-yrs)    Types: Cigarettes    Start date: 50    Quit date: 3    Years since quitting: 35.0   Smokeless tobacco: Never  Vaping Use   Vaping status: Never Used  Substance and Sexual Activity   Alcohol use: Yes    Comment: Social    Drug use: Never   Sexual activity: Not on file  Other Topics Concern   Not on file  Social History Narrative   Not on file   Social Drivers of Health   Financial Resource Strain: Not on file  Food Insecurity: Not on file  Transportation Needs: Not on file  Physical Activity: Not on file  Stress: Not on file  Social Connections: Not on file     Family History: The patient's family history includes COPD in his brother; Heart attack in his mother; High blood pressure in his father and mother; Stroke in his father. ROS:   Please see the history of present illness.    All 14 point review of systems negative except as described per history of present illness.  EKGs/Labs/Other Studies Reviewed:    The following studies were reviewed today:   EKG:  EKG Interpretation Date/Time:  Tuesday May 02 2023 10:56:29 EST Ventricular Rate:  66 PR Interval:    QRS Duration:  94 QT Interval:  416 QTC Calculation: 436 R Axis:   53  Text Interpretation: Significant baseline artifact limits interpretation Atrial fibrillation Abnormal  ECG When compared with ECG of 03-Apr-2023 17:21, Inverted T waves have replaced nonspecific T wave abnormality in Inferior leads Confirmed by Liborio Hai reddy [phone]) on 05/02/2023 11:22:13 AM    Recent Labs: 04/10/2023: BUN 26; Creatinine, Ser 1.38; NT-Pro BNP 1,864; Potassium 4.1; Sodium 140  Recent Lipid Panel No results found for: CHOL, TRIG, HDL, CHOLHDL, VLDL, LDLCALC, LDLDIRECT  Physical Exam:    VS:  BP 113/60 (BP Location: Left Arm, Patient Position: Standing, Cuff Size: Normal)   Pulse 70   Ht 6' (1.829 m)   Wt 221 lb (100.2 kg)   SpO2 99%   BMI 29.97 kg/m     Wt Readings from Last 3 Encounters:  05/02/23 221 lb (100.2 kg)  04/14/23 226 lb 3.2 oz (102.6 kg)  04/03/23 232 lb 9.6 oz (105.5 kg)     GENERAL:  Well nourished, well developed in no acute distress NECK: No JVD; No carotid bruits CARDIAC: Irregularly irregular, S1 and S2 present, no murmurs, no rubs, no gallops CHEST:  Clear to auscultation without rales, wheezing or rhonchi  Extremities: 1+ bilateral ankle edema. Pulses bilaterally symmetric with radial 2+ and dorsalis pedis 2+ NEUROLOGIC:  Alert and oriented x 3  Medication Adjustments/Labs and Tests Ordered: Current medicines are reviewed at length with the patient today.  Concerns regarding medicines are outlined above.  Orders Placed This Encounter  Procedures   Comprehensive metabolic panel   CBC   Pro b natriuretic peptide (BNP)   EKG 12-Lead   Meds ordered this  encounter  Medications   amLODipine  (NORVASC ) 5 MG tablet    Sig: Take 1 tablet (5 mg total) by mouth daily.    Dispense:  180 tablet    Refill:  3    Signed, Shenoa Hattabaugh reddy Omarian Jaquith, MD, MPH, [facility]. 05/02/2023 11:45 AM    [facility]

## 2023-05-03 DIAGNOSIS — R0602 Shortness of breath: Secondary | ICD-10-CM | POA: Diagnosis not present

## 2023-05-03 DIAGNOSIS — I10 Essential (primary) hypertension: Secondary | ICD-10-CM | POA: Diagnosis not present

## 2023-05-03 DIAGNOSIS — D649 Anemia, unspecified: Secondary | ICD-10-CM | POA: Diagnosis not present

## 2023-05-03 DIAGNOSIS — R918 Other nonspecific abnormal finding of lung field: Secondary | ICD-10-CM | POA: Diagnosis not present

## 2023-05-03 DIAGNOSIS — J449 Chronic obstructive pulmonary disease, unspecified: Secondary | ICD-10-CM | POA: Diagnosis not present

## 2023-05-03 DIAGNOSIS — K921 Melena: Secondary | ICD-10-CM | POA: Diagnosis not present

## 2023-05-03 DIAGNOSIS — I4891 Unspecified atrial fibrillation: Secondary | ICD-10-CM | POA: Diagnosis not present

## 2023-05-03 DIAGNOSIS — R9431 Abnormal electrocardiogram [ECG] [EKG]: Secondary | ICD-10-CM | POA: Diagnosis not present

## 2023-05-03 LAB — CBC
Hematocrit: 25.6 % — ABNORMAL LOW (ref 37.5–51.0)
Hemoglobin: 8.2 g/dL — ABNORMAL LOW (ref 13.0–17.7)
MCH: 30.8 pg (ref 26.6–33.0)
MCHC: 32 g/dL (ref 31.5–35.7)
MCV: 96 fL (ref 79–97)
Platelets: 228 10*3/uL (ref 150–450)
RBC: 2.66 x10E6/uL — CL (ref 4.14–5.80)
RDW: 12.7 % (ref 11.6–15.4)
WBC: 7.6 10*3/uL (ref 3.4–10.8)

## 2023-05-03 LAB — COMPREHENSIVE METABOLIC PANEL
ALT: 14 [IU]/L (ref 0–44)
AST: 15 [IU]/L (ref 0–40)
Albumin: 4.2 g/dL (ref 3.7–4.7)
Alkaline Phosphatase: 55 [IU]/L (ref 44–121)
BUN/Creatinine Ratio: 29 — ABNORMAL HIGH (ref 10–24)
BUN: 50 mg/dL — ABNORMAL HIGH (ref 8–27)
Bilirubin Total: 0.4 mg/dL (ref 0.0–1.2)
CO2: 22 mmol/L (ref 20–29)
Calcium: 8.8 mg/dL (ref 8.6–10.2)
Chloride: 102 mmol/L (ref 96–106)
Creatinine, Ser: 1.75 mg/dL — ABNORMAL HIGH (ref 0.76–1.27)
Globulin, Total: 1.7 g/dL (ref 1.5–4.5)
Glucose: 150 mg/dL — ABNORMAL HIGH (ref 70–99)
Potassium: 4.2 mmol/L (ref 3.5–5.2)
Sodium: 140 mmol/L (ref 134–144)
Total Protein: 5.9 g/dL — ABNORMAL LOW (ref 6.0–8.5)
eGFR: 38 mL/min/{1.73_m2} — ABNORMAL LOW (ref >59)

## 2023-05-03 LAB — PRO B NATRIURETIC PEPTIDE: NT-Pro BNP: 2570 pg/mL — ABNORMAL HIGH (ref 0–486)

## 2023-05-03 NOTE — Progress Notes (Signed)
 Please inform Ian Diaz that his blood test results from yesterday show drop in hemoglobin counts. This in the context of his reported black stools over the last couple of days is concerning for bleeding from the gut.  He has A-fib with good rate control and has been on Eliquis  for CHADS2 Vascor of 4. In the context of drop in hemoglobin suspend the Eliquis  and head to the nearest ER for him to be further evaluated and will need GI assessment and possibly EGD.  For reference Last CBC available to review is from 02/21/2023 at [facility] hemoglobin 13.6, hematocrit 40.3, platelets 157, WBC 5.1.  Hemoglobin now dropped to 8.2 and hematocrit 85.6 labs yesterday. Renal function appears to have slightly decreased with BUN 50 and creatinine 1.75 with EGFR 38 in comparison to blood work from 3 weeks ago.  Otherwise electrolytes and liver functions are normal.

## 2023-05-04 ENCOUNTER — Telehealth: Payer: Self-pay

## 2023-05-04 NOTE — Telephone Encounter (Signed)
-----   Message from [place] R Madireddy sent at 05/04/2023  8:51 AM EST ----- Hi, Mr. Agosta with history of A-fib, on anticoagulation with Eliquis, history of esophageal stricture s/p dilatation on 03/02/2023.  Seen in the office by me on 05-02-2023, he was reporting black stools for couple days. We obtained CBC that shows hemoglobin dropped to 8.2 on 05-03-2023. In comparison hemoglobin was 13.6 on 02-21-2023 at [facility].  We suspended Eliquis and had him go to the ER at [facility] on 05-03-2023, I reviewed the ER notes from yesterday and he received a unit of PRBC transfusion and, fecal occult blood was positive. ER provider after discussing with patient and based on patient's preference, lack of GI service for on-call this week he eventually went home.  Please call Mr. Burczyk and see how his symptoms are. - Discontinue the Eliquis, given risk of ongoing GI bleed. - Please have him follow-up with gastroenterologist ASAP. - Also send a note out to their PCP Dr. Luna Kitchens at [facility]. Thank you

## 2023-05-04 NOTE — Telephone Encounter (Signed)
Left vm to return call.    

## 2023-05-04 NOTE — Telephone Encounter (Signed)
Spoke with pt who states that he feels some better. Pt has an appointment Monday with Dr. Tama Headings. Pt verbalized to stop Eliquis and had no additional questions.

## 2023-05-08 DIAGNOSIS — I4891 Unspecified atrial fibrillation: Secondary | ICD-10-CM | POA: Diagnosis not present

## 2023-05-08 DIAGNOSIS — K219 Gastro-esophageal reflux disease without esophagitis: Secondary | ICD-10-CM | POA: Diagnosis not present

## 2023-05-08 DIAGNOSIS — D649 Anemia, unspecified: Secondary | ICD-10-CM | POA: Diagnosis not present

## 2023-05-08 DIAGNOSIS — K922 Gastrointestinal hemorrhage, unspecified: Secondary | ICD-10-CM | POA: Diagnosis not present

## 2023-05-08 DIAGNOSIS — Z7901 Long term (current) use of anticoagulants: Secondary | ICD-10-CM | POA: Diagnosis not present

## 2023-05-08 DIAGNOSIS — N289 Disorder of kidney and ureter, unspecified: Secondary | ICD-10-CM | POA: Diagnosis not present

## 2023-05-08 DIAGNOSIS — K5790 Diverticulosis of intestine, part unspecified, without perforation or abscess without bleeding: Secondary | ICD-10-CM | POA: Diagnosis not present

## 2023-05-08 DIAGNOSIS — K222 Esophageal obstruction: Secondary | ICD-10-CM | POA: Diagnosis not present

## 2023-05-12 DIAGNOSIS — D509 Iron deficiency anemia, unspecified: Secondary | ICD-10-CM | POA: Diagnosis not present

## 2023-05-16 DIAGNOSIS — K635 Polyp of colon: Secondary | ICD-10-CM | POA: Diagnosis not present

## 2023-05-16 DIAGNOSIS — I4891 Unspecified atrial fibrillation: Secondary | ICD-10-CM | POA: Diagnosis not present

## 2023-05-16 DIAGNOSIS — Z860100 Personal history of colon polyps, unspecified: Secondary | ICD-10-CM | POA: Diagnosis not present

## 2023-05-16 DIAGNOSIS — K5790 Diverticulosis of intestine, part unspecified, without perforation or abscess without bleeding: Secondary | ICD-10-CM | POA: Diagnosis not present

## 2023-05-16 DIAGNOSIS — K222 Esophageal obstruction: Secondary | ICD-10-CM | POA: Diagnosis not present

## 2023-05-16 DIAGNOSIS — K219 Gastro-esophageal reflux disease without esophagitis: Secondary | ICD-10-CM | POA: Diagnosis not present

## 2023-05-16 DIAGNOSIS — D509 Iron deficiency anemia, unspecified: Secondary | ICD-10-CM | POA: Diagnosis not present

## 2023-05-16 DIAGNOSIS — K648 Other hemorrhoids: Secondary | ICD-10-CM | POA: Diagnosis not present

## 2023-05-16 DIAGNOSIS — I10 Essential (primary) hypertension: Secondary | ICD-10-CM | POA: Diagnosis not present

## 2023-05-16 DIAGNOSIS — D124 Benign neoplasm of descending colon: Secondary | ICD-10-CM | POA: Diagnosis not present

## 2023-05-16 DIAGNOSIS — D126 Benign neoplasm of colon, unspecified: Secondary | ICD-10-CM | POA: Diagnosis not present

## 2023-05-16 DIAGNOSIS — D500 Iron deficiency anemia secondary to blood loss (chronic): Secondary | ICD-10-CM | POA: Diagnosis not present

## 2023-05-16 DIAGNOSIS — K449 Diaphragmatic hernia without obstruction or gangrene: Secondary | ICD-10-CM | POA: Diagnosis not present

## 2023-05-16 DIAGNOSIS — K5730 Diverticulosis of large intestine without perforation or abscess without bleeding: Secondary | ICD-10-CM | POA: Diagnosis not present

## 2023-05-16 DIAGNOSIS — K2970 Gastritis, unspecified, without bleeding: Secondary | ICD-10-CM | POA: Diagnosis not present

## 2023-05-19 DIAGNOSIS — D509 Iron deficiency anemia, unspecified: Secondary | ICD-10-CM | POA: Diagnosis not present

## 2023-05-29 DIAGNOSIS — D649 Anemia, unspecified: Secondary | ICD-10-CM | POA: Diagnosis not present

## 2023-05-30 ENCOUNTER — Ambulatory Visit: Payer: Medicare Other

## 2023-06-01 DIAGNOSIS — H43821 Vitreomacular adhesion, right eye: Secondary | ICD-10-CM | POA: Diagnosis not present

## 2023-06-02 ENCOUNTER — Ambulatory Visit: Payer: Medicare Other

## 2023-06-02 VITALS — BP 104/56 | HR 76 | Ht 72.0 in | Wt 218.6 lb

## 2023-06-02 DIAGNOSIS — I2510 Atherosclerotic heart disease of native coronary artery without angina pectoris: Secondary | ICD-10-CM | POA: Diagnosis not present

## 2023-06-02 DIAGNOSIS — I4891 Unspecified atrial fibrillation: Secondary | ICD-10-CM

## 2023-06-02 DIAGNOSIS — I5032 Chronic diastolic (congestive) heart failure: Secondary | ICD-10-CM

## 2023-06-02 DIAGNOSIS — Z01810 Encounter for preprocedural cardiovascular examination: Secondary | ICD-10-CM

## 2023-06-02 MED ORDER — AMLODIPINE BESYLATE 2.5 MG PO TABS: 2.5000 mg | ORAL_TABLET | Freq: Every day | ORAL | 3 refills | Status: DC

## 2023-06-02 NOTE — Assessment & Plan Note (Signed)
Doing well. Dyspnea on exertion appears to be a combination of deconditioning, heart failure, anemia and A-fib. Pulmonary component given his COPD cannot be entirely excluded.  However at this time he appears compensated, euvolemic, no active wheezing. Anemia could be a significant contributing factor to his symptoms.  However no significant drop. Continue with the current medications. Discussed further evaluation with stress test to rule out any significant underlying ischemia but since he did not see any significant change in over the last many months he wants to hold off at this time.

## 2023-06-02 NOTE — Assessment & Plan Note (Signed)
Appears euvolemic and compensated. Weight today by office scale 218 pounds. At home mentions weight is around 215 pounds.  Continue with salt and fluid restriction to below 2 g/day and below 2 L/day respectively.  Continue with furosemide 40 mg once daily and additional 40 mg dose in the afternoon every other day.  Cut down amlodipine dose further down to 2.5 mg once daily.

## 2023-06-02 NOTE — Assessment & Plan Note (Signed)
Elevated CHADS2 Vascor. Back on anticoagulation with Eliquis 5 mg twice daily after his recent EGD colonoscopy workup in January 2025. Continue with rate control.  Given his recent EGD colonoscopy workup for anemia, low blood counts persistently at this time with hemoglobin still in nines, will hold off on aggressive rhythm control pursued right away.  Will follow-up tentatively in 3 months and if stable with blood counts no active bleeding & consistently on anticoagulation we will proceed with rhythm control discussion.

## 2023-06-02 NOTE — Patient Instructions (Signed)
Medication Instructions:    Decrease Amlodipine to 2.5 mg once a day.  *If you need a refill on your cardiac medications before your next appointment, please call your pharmacy*   Lab Work: None Ordered If you have labs (blood work) drawn today and your tests are completely normal, you will receive your results only by: MyChart Message (if you have MyChart) OR A paper copy in the mail If you have any lab test that is abnormal or we need to change your treatment, we will call you to review the results.   Testing/Procedures: None Ordered   Follow-Up: At [facility], you and your health needs are our priority.  As part of our continuing mission to provide you with exceptional heart care, we have created designated Provider Care Teams.  These Care Teams include your primary Cardiologist (physician) and Advanced Practice Providers (APPs -  Physician Assistants and Nurse Practitioners) who all work together to provide you with the care you need, when you need it.  We recommend signing up for the patient portal called "MyChart".  Sign up information is provided on this After Visit Summary.  MyChart is used to connect with patients for Virtual Visits (Telemedicine).  Patients are able to view lab/test results, encounter notes, upcoming appointments, etc.  Non-urgent messages can be sent to your provider as well.   To learn more about what you can do with MyChart, go to [email or web address].    Your next appointment:   3 month follow up

## 2023-06-02 NOTE — Progress Notes (Signed)
Cardiology Consultation:    Date:  06/02/2023   ID:  Ian Diaz, DOB Jul 31, 1935, MRN [id number]  PCP:  Lise Auer, MD  Cardiologist:  Marlyn Corporal Charlane Westry, MD   Referring MD: Lise Auer, MD   No chief complaint on file.    ASSESSMENT AND PLAN:   Mr Doggett chronic CHF with preserved ejection fraction, CAD single-vessel disease s/p PCI of RCA in November 2020, stress test February 2024 without ischemia, pulmonary hypertension on prior echocardiogram normal 2020 but normal on last echocardiogram from March 2024, persistent atrial fibrillation [diagnosed November 2024], hypertension, hyperlipidemia, here for follow-up Problem List Items Addressed This Visit     Atrial fibrillation with controlled ventricular response (HCC), persistent, last Zio patch March 2024 reportedly predominantly sinus rhythm   Elevated CHADS2 Vascor. Back on anticoagulation with Eliquis 5 mg twice daily after his recent EGD colonoscopy workup in January 2025. Continue with rate control.  Given his recent EGD colonoscopy workup for anemia, low blood counts persistently at this time with hemoglobin still in nines, will hold off on aggressive rhythm control pursued right away.  Will follow-up tentatively in 3 months and if stable with blood counts no active bleeding & consistently on anticoagulation we will proceed with rhythm control discussion.      Relevant Medications   amLODipine (NORVASC) 2.5 MG tablet   CAD, h/o NSTEMI and s/p Cath 03/07/2019 with severe RCA stenosis PCI with DES; Lexiscan stress MPI 06/13/2022 normal perfusion, no ischemia/infarct - Primary   Doing well. Dyspnea on exertion appears to be a combination of deconditioning, heart failure, anemia and A-fib. Pulmonary component given his COPD cannot be entirely excluded.  However at this time he appears compensated, euvolemic, no active wheezing. Anemia could be a significant contributing factor to his symptoms.  However no significant  drop. Continue with the current medications. Discussed further evaluation with stress test to rule out any significant underlying ischemia but since he did not see any significant change in over the last many months he wants to hold off at this time.      Relevant Medications   amLODipine (NORVASC) 2.5 MG tablet   Preop cardiovascular exam, prior to EGD for esophageal dilatation   Chronic CHF (congestive heart failure) (HCC)   Appears euvolemic and compensated. Weight today by office scale 218 pounds. At home mentions weight is around 215 pounds.  Continue with salt and fluid restriction to below 2 g/day and below 2 L/day respectively.  Continue with furosemide 40 mg once daily and additional 40 mg dose in the afternoon every other day.  Cut down amlodipine dose further down to 2.5 mg once daily.        Relevant Medications   amLODipine (NORVASC) 2.5 MG tablet      History of Present Illness:    Ian Diaz is a 88 y.o. male who is being seen today for follow up visit. PCP is Lise Auer, MD. Last office visit with [place] was 05/02/2023  Has history of CHF with preserved EF, CAD single-vessel disease s/p PCI of RCA in November 2020, stress test February 2024 without ischemia, persistent atrial fibrillation [diagnosed normal 2024], hypertension, hyperlipidemia.  At last visit he reported black stools.  CBC noted anemia.  Sent to [facility] after reviewing the results where he received PRBC transfusion and subsequently followed up with Dr.Misenheimer underwent EGD and colonoscopy at [facility] on 05-16-2023. EGD noted mild gastritis and hiatal hernia and was recommended to restart  Eliquis and continued omeprazole 2 times a day along with famotidine at bedtime.  Colonoscopy noted diverticular disease of the colon with a diminutive colonic polyp noted to be tubular adenoma and internal hemorrhoids.  Blood work from 05-29-2023 at [facility]  noted hemoglobin 9.3, hematocrit 28.8, WBC 5, platelets 182. Total iron levels 149 which is an improvement from 25 back on May 08, 2023. Total iron binding capacity 43 which is an improvement from 7.9 back in January 2025.  He mentions overall his main symptom tends to be feeling of not having enough capacity and feeling out of breath after doing some exertional activities.  He mentions he enjoys outdoors and wants to continue to be able to do the same. Denies any chest pain shortness of breath palpitations, lightheadedness or dizziness when he is sitting or doing mild day-to-day activities in the house.  Denies any syncopal or near syncopal episodes. Denies any blood in urine or stools at this time. Mentions weight at home around 213 215 pounds by home scale.  No significant increase. Has been continuing to take furosemide 40 mg daily and an additional 40 mg dose every other day in the afternoon. Mindful about weight gain and salt intake.  Mentions he does have COPD and PCP has him on Singulair and Atrovent. He does continue to have mild cough especially in the morning when he wakes up and brings up sputum which is clear.  Blood pressure runs relatively soft.  Mentions sudden change in position at times he can feel lightheaded which does not occur when he takes time to changes position and start.   Past Medical History:  Diagnosis Date   Acid reflux    Acute on chronic combined systolic and diastolic CHF (congestive heart failure) (HCC) 04/03/2023   Atrial fibrillation with controlled ventricular response (HCC) 06/07/2022   Bilateral lower extremity edema, possibly suggest chronic heart failure with preserved ejection fraction in the setting of LVH, previous echocardiograms with pulmonary hypertension 02/24/2023   Chest pain 03/07/2019   Chronic coronary artery disease 03/09/2019   LHC 03/07/2019 - 1 vessel CAD severe RCA stenosis s/p PCI/DES.     Diverticulosis    DOE (dyspnea on  exertion) 10/07/2019   Onset 04/2019  p MI / stent 02/2019  - assoc with cough resolved off acei   - Baseline Spirometry 04/19/2018  FEV1 2.6 (83%)  Ratio 0.77 min concavity  To f/v curve   Echo 09/27/19 The left ventricular size is normal.    There is mild concentric left ventricular hypertrophy.    LV ejection fraction = 60-65%.    The left ventricular wall motion is normal.    The left atrium is mildly dilated.    There    Dysphagia    Esophageal reflux disease    Esophageal stricture    Former smoker 03/19/2019   Heart palpitations 06/07/2022   History of anemia    History of colon polyps    History of heart attack    History of tobacco abuse    Hypertension    Hypertension, benign    Iron deficiency anemia    Long-term use of aspirin therapy 03/12/2019   Mixed hyperlipidemia 03/12/2019   NSTEMI (non-ST elevated myocardial infarction) (HCC) 03/07/2019   Added automatically from request for surgery [id number]  [facility] 03/07/2019 sever RCA stenosis s/p DES.     Preop cardiovascular exam, prior to EGD for esophageal dilatation 02/24/2023   Pulmonary hypertension (HCC) 03/12/2019   Rheumatic fever childhood  Solitary pulmonary nodule on lung CT 05/10/2018   Chest CT 02/28/18  1) enchondroma R lat 9th rib stable since 2009   2) 9 mm RLL well circ rec 6-12 m f/u > CT 03/05/2019  slt decrease > no directed f/u needed         Swelling of right lower extremity 03/07/2019   Upper airway cough syndrome 04/19/2018   Onset was summer 2018     Prior GI eval 08/2017 egd c/w HH and was dilated empirically   - Spirometry 04/19/2018  FEV1 2.6 (83%)  Ratio 73 s curvature  s prior rx   - FENO 04/19/2018  =   30   - Allergy profile 04/19/2018 >  Eos 0.4 /  IgE  206 RAST neg   - Dg Es 04/26/2018 1. Small type 1 hiatal hernia.  2. Mild smooth narrowing of the distal esophagus, without  irregularity, maximum distended diameter bo    Past Surgical History:  Procedure Laterality Date   ADENOIDECTOMY     BREAST SURGERY      Tumor   HERNIA REPAIR     SKIN CANCER EXCISION     STENT PLACEMENT VASCULAR ([facility] HX)     TONSILLECTOMY      Current Medications: Current Meds  Medication Sig   amLODipine (NORVASC) 2.5 MG tablet Take 1 tablet (2.5 mg total) by mouth daily.   apixaban (ELIQUIS) 5 MG TABS tablet Take 1 tablet (5 mg total) by mouth 2 (two) times daily.   atorvastatin (LIPITOR) 80 MG tablet Take 1 tablet by mouth daily.   candesartan-hydrochlorothiazide (ATACAND HCT) 16-12.5 MG tablet Take 1 tablet by mouth daily.   carvedilol (COREG) 12.5 MG tablet Take 12.5 mg by mouth 2 (two) times daily with a meal.   famotidine (PEPCID) 20 MG tablet Take 1 tablet by mouth at bedtime.   ferrous sulfate 325 (65 FE) MG tablet Take 1 tablet by mouth daily.   furosemide (LASIX) 40 MG tablet Take 1 tablet (40 mg total) by mouth every other day. Take 40 mg every morning, and take an additional 40 mg every other afternoon   ipratropium (ATROVENT) 0.06 % nasal spray Place 1 spray into both nostrils at bedtime.   montelukast (SINGULAIR) 10 MG tablet Take 1 tablet by mouth at bedtime.   Multiple Vitamins-Minerals (CENTRUM SILVER PO) Take 1 tablet by mouth daily. 0.4 mg-300 mcg-250 mcg   nitroGLYCERIN (NITROSTAT) 0.4 MG SL tablet Place 0.4 mg under the tongue every 5 (five) minutes as needed for chest pain.   omeprazole (PRILOSEC) 40 MG capsule Take 40 mg by mouth 2 (two) times daily.   Potassium Chloride ER 20 MEQ TBCR Take 1 tablet by mouth daily.   [DISCONTINUED] amLODipine (NORVASC) 5 MG tablet Take 1 tablet (5 mg total) by mouth daily.     Allergies:   Patient has no known allergies.   Social History   Socioeconomic History   Marital status: Married    Spouse name: Not on file   Number of children: Not on file   Years of education: Not on file   Highest education level: Not on file  Occupational History   Not on file  Tobacco Use   Smoking status: Former    Current packs/day: 0.00    Average packs/day: 3.0  packs/day for 40.0 years (120.0 ttl pk-yrs)    Types: Cigarettes    Start date: 43    Quit date: 31    Years since quitting: 35.1  Smokeless tobacco: Never  Vaping Use   Vaping status: Never Used  Substance and Sexual Activity   Alcohol use: Yes    Comment: Social    Drug use: Never   Sexual activity: Not on file  Other Topics Concern   Not on file  Social History Narrative   Not on file   Social Drivers of Health   Financial Resource Strain: Not on file  Food Insecurity: Not on file  Transportation Needs: Not on file  Physical Activity: Not on file  Stress: Not on file  Social Connections: Not on file     Family History: The patient's family history includes COPD in his brother; Heart attack in his mother; High blood pressure in his father and mother; Stroke in his father. ROS:   Please see the history of present illness.    All 14 point review of systems negative except as described per history of present illness.  EKGs/Labs/Other Studies Reviewed:    The following studies were reviewed today:   EKG:       Recent Labs: 05/02/2023: ALT 14; BUN 50; Creatinine, Ser 1.75; Hemoglobin 8.2; NT-Pro BNP 2,570; Platelets 228; Potassium 4.2; Sodium 140  Recent Lipid Panel No results found for: "CHOL", "TRIG", "HDL", "CHOLHDL", "VLDL", "LDLCALC", "LDLDIRECT"  Physical Exam:    VS:  BP (!) 104/56   Pulse 76   Ht 6' (1.829 m)   Wt 218 lb 9.6 oz (99.2 kg)   SpO2 99%   BMI 29.65 kg/m     Wt Readings from Last 3 Encounters:  06/02/23 218 lb 9.6 oz (99.2 kg)  05/02/23 221 lb (100.2 kg)  04/14/23 226 lb 3.2 oz (102.6 kg)     GENERAL:  Well nourished, well developed in no acute distress NECK: No JVD; No carotid bruits CARDIAC: Irregularly irregular, S1 and S2 present, no murmurs, no rubs, no gallops CHEST:  Clear to auscultation without rales, wheezing or rhonchi  Extremities: 1+ bilateral pitting pedal edema. Pulses bilaterally symmetric with radial 2+ and  dorsalis pedis 2+ NEUROLOGIC:  Alert and oriented x 3  Medication Adjustments/Labs and Tests Ordered: Current medicines are reviewed at length with the patient today.  Concerns regarding medicines are outlined above.  No orders of the defined types were placed in this encounter.  Meds ordered this encounter  Medications   amLODipine (NORVASC) 2.5 MG tablet    Sig: Take 1 tablet (2.5 mg total) by mouth daily.    Dispense:  180 tablet    Refill:  3    Signed, Kristina Bertone reddy Olar Santini, MD, MPH, [facility]. 06/02/2023 8:51 AM    [facility]

## 2023-06-26 ENCOUNTER — Telehealth: Payer: Self-pay

## 2023-06-26 NOTE — Telephone Encounter (Signed)
 Spoke with pt who states that for the past week he has increased weakness/fatigue, dizziness, shortness of breath and occasional few seconds of chest tightness. Pt does not have any bp readings. Advised to start BP log from sitting/ standing position and bring to appointment. Pt advised to use NTG for chest tightness and call 911 for continued chest tightness.  Pt verbalized understanding and had no additional questions

## 2023-06-26 NOTE — Telephone Encounter (Signed)
 Patient experiencing weakness/scheduled an appt with [facility] for Wednesday as he has an appt this afternoon to get his taxes done/ please call to triage further- [phone]

## 2023-06-28 ENCOUNTER — Ambulatory Visit

## 2023-06-28 VITALS — BP 104/60 | HR 68 | Ht 72.0 in | Wt 216.0 lb

## 2023-06-28 DIAGNOSIS — I272 Pulmonary hypertension, unspecified: Secondary | ICD-10-CM | POA: Diagnosis not present

## 2023-06-28 DIAGNOSIS — I5032 Chronic diastolic (congestive) heart failure: Secondary | ICD-10-CM

## 2023-06-28 DIAGNOSIS — I4891 Unspecified atrial fibrillation: Secondary | ICD-10-CM

## 2023-06-28 DIAGNOSIS — I2510 Atherosclerotic heart disease of native coronary artery without angina pectoris: Secondary | ICD-10-CM

## 2023-06-28 NOTE — Assessment & Plan Note (Addendum)
 Appears compensated.  Mild bilateral lower extremity edema which appears chronic and not significantly worse.  Weight down by 2 pounds since last visit. Tolerating his current dose of Lasix daily 40 mg with alternating days additional 40 mg in the afternoon.  Continue fluid restriction to 2 L/day and salt restriction below 2 g/day.  Given his symptoms of postural lightheadedness, discontinue amlodipine. Continues on carvedilol 12.5 mg twice daily for heart failure, hypertension and A-fib rate controlled.  Will obtain transthoracic echocardiogram for interval assessment of LV function, wall motion and pulmonary hypertension.  Obtain CBC, CMP, magnesium

## 2023-06-28 NOTE — Assessment & Plan Note (Signed)
 Functional status baseline CCS class II/III.  Dyspnea on exertion appears multifactorial, combination of heart failure, deconditioning, anemia and underlying A-fib.  However he is able to be functional day-to-day.  He aspires to do more.   Recommended continue to incrementally increase his activities in order to help condition the heart.  Encouraged him to exercise regularly and stop related.  If any further decline in his cardiac functional status, can consider further ischemic workup.

## 2023-06-28 NOTE — Assessment & Plan Note (Signed)
 Rates well-controlled. Remains in atrial fibrillation.  CHA2DS2-VASc score 4. On anticoagulation with Eliquis 5 mg twice daily.  Currently tolerating well.  Given her advanced age and relatively well-controlled controlled heart rates and reasonable functional status, and somewhat high risk for bleeding due to unknown GI related blood loss in the past, we will pursue rhythm control only if he is having uncontrolled heart rates or persistent shortness of breath without any other obvious cause.  Will review echocardiogram for any significant changes.

## 2023-06-28 NOTE — Patient Instructions (Addendum)
 Medication Instructions:    Stop Amlodipine.  *If you need a refill on your cardiac medications before your next appointment, please call your pharmacy*   Lab Work: Your physician recommends that you have labs done in the office today. Your test included  basic metabolic panel, complete blood count, and magnesium.   If you have labs (blood work) drawn today and your tests are completely normal, you will receive your results only by: MyChart Message (if you have MyChart) OR A paper copy in the mail If you have any lab test that is abnormal or we need to change your treatment, we will call you to review the results.   Testing/Procedures: Echocardiogram An echocardiogram is a test that uses sound waves (ultrasound) to produce images of the heart. Images from an echocardiogram can provide important information about: Heart size and shape. The size and thickness and movement of your heart's walls. Heart muscle function and strength. Heart valve function or if you have stenosis. Stenosis is when the heart valves are too narrow. If blood is flowing backward through the heart valves (regurgitation). A tumor or infectious growth around the heart valves. Areas of heart muscle that are not working well because of poor blood flow or injury from a heart attack. Aneurysm detection. An aneurysm is a weak or damaged part of an artery wall. The wall bulges out from the normal force of blood pumping through the body. Tell a health care provider about: Any allergies you have. All medicines you are taking, including vitamins, herbs, eye drops, creams, and over-the-counter medicines. Any blood disorders you have. Any surgeries you have had. Any medical conditions you have. Whether you are pregnant or may be pregnant. What are the risks? Generally, this is a safe test. However, problems may occur, including an allergic reaction to dye (contrast) that may be used during the test. What happens before  the test? No specific preparation is needed. You may eat and drink normally. What happens during the test?  You will take off your clothes from the waist up and put on a hospital gown. Electrodes or electrocardiogram (ECG)patches may be placed on your chest. The electrodes or patches are then connected to a device that monitors your heart rate and rhythm. You will lie down on a table for an ultrasound exam. A gel will be applied to your chest to help sound waves pass through your skin. A handheld device, called a transducer, will be pressed against your chest and moved over your heart. The transducer produces sound waves that travel to your heart and bounce back (or "echo" back) to the transducer. These sound waves will be captured in real-time and changed into images of your heart that can be viewed on a video monitor. The images will be recorded on a computer and reviewed by your health care provider. You may be asked to change positions or hold your breath for a short time. This makes it easier to get different views or better views of your heart. In some cases, you may receive contrast through an IV in one of your veins. This can improve the quality of the pictures from your heart. The procedure may vary among health care providers and hospitals. What can I expect after the test? You may return to your normal, everyday life, including diet, activities, and medicines, unless your health care provider tells you not to do that. Follow these instructions at home: It is up to you to get the results of your test.  Ask your health care provider, or the department that is doing the test, when your results will be ready. Keep all follow-up visits. This is important. Summary An echocardiogram is a test that uses sound waves (ultrasound) to produce images of the heart. Images from an echocardiogram can provide important information about the size and shape of your heart, heart muscle function, heart valve  function, and other possible heart problems. You do not need to do anything to prepare before this test. You may eat and drink normally. After the echocardiogram is completed, you may return to your normal, everyday life, unless your health care provider tells you not to do that. This information is not intended to replace advice given to you by your health care provider. Make sure you discuss any questions you have with your health care provider. Document Revised: 12/16/2020 Document Reviewed: 11/26/2019 Elsevier Patient Education  2023 Elsevier Inc.        Follow-Up: At [facility], you and your health needs are our priority.  As part of our continuing mission to provide you with exceptional heart care, we have created designated Provider Care Teams.  These Care Teams include your primary Cardiologist (physician) and Advanced Practice Providers (APPs -  Physician Assistants and Nurse Practitioners) who all work together to provide you with the care you need, when you need it.  We recommend signing up for the patient portal called "MyChart".  Sign up information is provided on this After Visit Summary.  MyChart is used to connect with patients for Virtual Visits (Telemedicine).  Patients are able to view lab/test results, encounter notes, upcoming appointments, etc.  Non-urgent messages can be sent to your provider as well.   To learn more about what you can do with MyChart, go to [email or web address].    Your next appointment:   6 month follow up with Dr. Vincent Gros

## 2023-06-28 NOTE — Progress Notes (Signed)
 Cardiology Consultation:    Date:  06/28/2023   ID:  Ian Diaz, DOB Oct 05, 1935, MRN [id number]  PCP:  Lise Auer, MD  Cardiologist:  Marlyn Corporal Jadarius Commons, MD   Referring MD: Lise Auer, MD   No chief complaint on file.    ASSESSMENT AND PLAN:   Ian Diaz 88 year old male with history of chronic CHF with preserved ejection fraction, coronary artery disease single-vessel s/p PCI of RCA in November 2020, stress test February 2024 without ischemia, persistent atrial fibrillation [diagnosed November 2024 normal on anticoagulation with Eliquis], hypertension, hyperlipidemia, no significant pulmonary hypertension on recent echocardiogram, anemia requiring PRBC transfusion and subsequently started on iron supplementation after mild gastritis and hiatal hernia noted on recent EGD in January 2025, diverticular disease and tubular adenoma and internal hemorrhoids on colonoscopy January 2025, last echocardiogram March 2024 at [facility] health reported LVEF 65 to 70%, global longitudinal strain -14.6%, dilated atria, mild TR, mild pulmonary insufficiency with moderate eccentric left ventricular hypertrophy.  He also has known history of esophageal luminal narrowing [EGD October 2024 at [facility] and more recently January 2025 at [facility]] and was in the process of getting esophageal dilatation done.   Problem List Items Addressed This Visit     Atrial fibrillation with controlled ventricular response (HCC), persistent, last Zio patch March 2024 reportedly predominantly sinus rhythm   Rates well-controlled. Remains in atrial fibrillation.  CHA2DS2-VASc score 4. On anticoagulation with Eliquis 5 mg twice daily.  Currently tolerating well.  Given her advanced age and relatively well-controlled controlled heart rates and reasonable functional status, and somewhat high risk for bleeding due to unknown GI related blood loss in the past, we will pursue rhythm control only if he is having  uncontrolled heart rates or persistent shortness of breath without any other obvious cause.  Will review echocardiogram for any significant changes.        CAD, h/o NSTEMI and s/p Cath 03/07/2019 with severe RCA stenosis PCI with DES; Lexiscan stress MPI 06/13/2022 normal perfusion, no ischemia/infarct - Primary   Functional status baseline CCS class II/III.  Dyspnea on exertion appears multifactorial, combination of heart failure, deconditioning, anemia and underlying A-fib.  However he is able to be functional day-to-day.  He aspires to do more.   Recommended continue to incrementally increase his activities in order to help condition the heart.  Encouraged him to exercise regularly and stop related.  If any further decline in his cardiac functional status, can consider further ischemic workup.      Relevant Orders   EKG 12-Lead (Completed)   Pulmonary hypertension (HCC) (Chronic)   Relevant Orders   Comprehensive metabolic panel   CBC   Magnesium   Chronic CHF (congestive heart failure) (HCC)   Appears compensated.  Mild bilateral lower extremity edema which appears chronic and not significantly worse.  Weight down by 2 pounds since last visit. Tolerating his current dose of Lasix daily 40 mg with alternating days additional 40 mg in the afternoon.  Continue fluid restriction to 2 L/day and salt restriction below 2 g/day.  Given his symptoms of postural lightheadedness, discontinue amlodipine. Continues on carvedilol 12.5 mg twice daily for heart failure, hypertension and A-fib rate controlled.  Will obtain transthoracic echocardiogram for interval assessment of LV function, wall motion and pulmonary hypertension.  Obtain CBC, CMP, magnesium      Relevant Orders   Comprehensive metabolic panel   CBC   ECHOCARDIOGRAM COMPLETE   Magnesium   Return to  clinic tentatively in 6 months   History of Present Illness:    Ian Diaz is a 87 y.o. male who is being seen today  for follow-up visit. Last visit in the office with me was 06-02-2023. PCP is Lise Auer, MD.  Has history of chronic CHF with preserved ejection fraction, coronary artery disease single-vessel s/p PCI of RCA in November 2020, stress test February 2024 without ischemia, persistent atrial fibrillation [diagnosed November 2024 normal on anticoagulation with Eliquis], hypertension, hyperlipidemia, no significant pulmonary hypertension on recent echocardiogram, anemia requiring PRBC transfusion and subsequently started on iron supplementation after mild gastritis and hiatal hernia noted on recent EGD in January 2025, diverticular disease and tubular adenoma and internal hemorrhoids on colonoscopy January 2025, last echocardiogram March 2024 at [facility] health reported LVEF 65 to 70%, global longitudinal strain -14.6%, dilated atria, mild TR, mild pulmonary insufficiency with moderate eccentric left ventricular hypertrophy  Home weight typically around 213-215 pounds. At last visit was 218 pounds. Weight today in the clinic 216 pounds and at home 214 pounds.  Mentions overall feels well. Adherent with his furosemide dose 40 mg once a day in the morning and additional 40 mg every other day in the afternoon.  Denies any chest pain or shortness of breath at rest.  Walks about 100 feet before he has to take a brief break for shortness of breath.  Relieves with rest.  Mentions lightheadedness on sudden change of position.  No falls.  No syncopal episodes.  Blood pressure readings from home noted systolic from 90s to 110s.  Mild bilateral lower extremity edema persistent.  Mentions no significant worsening.  Mentions he wishes to continue treatment to avoid any stroke or heart attacks.  Denies any trouble swallowing foods.  Mentions however he has been eating small portions.  No blood in urine or stools.  EKG in the clinic today shows atrial fibrillation, rate controlled 68/min.  QRS duration 104  ms.  Past Medical History:  Diagnosis Date   Acid reflux    Acute on chronic combined systolic and diastolic CHF (congestive heart failure) (HCC) 04/03/2023   Atrial fibrillation with controlled ventricular response (HCC) 06/07/2022   Bilateral lower extremity edema, possibly suggest chronic heart failure with preserved ejection fraction in the setting of LVH, previous echocardiograms with pulmonary hypertension 02/24/2023   Chest pain 03/07/2019   Chronic CHF (congestive heart failure) (HCC) 04/03/2023   Chronic coronary artery disease 03/09/2019   LHC 03/07/2019 - 1 vessel CAD severe RCA stenosis s/p PCI/DES.     Diverticulosis    DOE (dyspnea on exertion) 10/07/2019   Onset 04/2019  p MI / stent 02/2019  - assoc with cough resolved off acei   - Baseline Spirometry 04/19/2018  FEV1 2.6 (83%)  Ratio 0.77 min concavity  To f/v curve   Echo 09/27/19 The left ventricular size is normal.    There is mild concentric left ventricular hypertrophy.    LV ejection fraction = 60-65%.    The left ventricular wall motion is normal.    The left atrium is mildly dilated.    There    Dysphagia    Esophageal reflux disease    Esophageal stricture    Former smoker 03/19/2019   Heart palpitations 06/07/2022   History of anemia    History of colon polyps    History of heart attack    History of tobacco abuse    Hypertension    Hypertension, benign    Iron deficiency anemia  Long-term use of aspirin therapy 03/12/2019   Mixed hyperlipidemia 03/12/2019   NSTEMI (non-ST elevated myocardial infarction) (HCC) 03/07/2019   Added automatically from request for surgery [id number]  [facility] 03/07/2019 sever RCA stenosis s/p DES.     Preop cardiovascular exam, prior to EGD for esophageal dilatation 02/24/2023   Pulmonary hypertension (HCC) 03/12/2019   Rheumatic fever childhood   Solitary pulmonary nodule on lung CT 05/10/2018   Chest CT 02/28/18  1) enchondroma R lat 9th rib stable since 2009   2) 9 mm RLL well circ  rec 6-12 m f/u > CT 03/05/2019  slt decrease > no directed f/u needed         Swelling of right lower extremity 03/07/2019   Upper airway cough syndrome 04/19/2018   Onset was summer 2018     Prior GI eval 08/2017 egd c/w HH and was dilated empirically   - Spirometry 04/19/2018  FEV1 2.6 (83%)  Ratio 73 s curvature  s prior rx   - FENO 04/19/2018  =   30   - Allergy profile 04/19/2018 >  Eos 0.4 /  IgE  206 RAST neg   - Dg Es 04/26/2018 1. Small type 1 hiatal hernia.  2. Mild smooth narrowing of the distal esophagus, without  irregularity, maximum distended diameter bo    Past Surgical History:  Procedure Laterality Date   ADENOIDECTOMY     BREAST SURGERY     Tumor   HERNIA REPAIR     SKIN CANCER EXCISION     STENT PLACEMENT VASCULAR ([facility] HX)     TONSILLECTOMY      Current Medications: Current Meds  Medication Sig   amLODipine (NORVASC) 2.5 MG tablet Take 1 tablet (2.5 mg total) by mouth daily.   apixaban (ELIQUIS) 5 MG TABS tablet Take 1 tablet (5 mg total) by mouth 2 (two) times daily.   atorvastatin (LIPITOR) 80 MG tablet Take 1 tablet by mouth daily.   candesartan-hydrochlorothiazide (ATACAND HCT) 16-12.5 MG tablet Take 1 tablet by mouth daily.   carvedilol (COREG) 12.5 MG tablet Take 12.5 mg by mouth 2 (two) times daily with a meal.   famotidine (PEPCID) 20 MG tablet Take 1 tablet by mouth at bedtime.   ferrous sulfate 325 (65 FE) MG tablet Take 1 tablet by mouth daily.   furosemide (LASIX) 40 MG tablet Take 1 tablet (40 mg total) by mouth every other day. Take 40 mg every morning, and take an additional 40 mg every other afternoon   ipratropium (ATROVENT) 0.06 % nasal spray Place 1 spray into both nostrils at bedtime.   montelukast (SINGULAIR) 10 MG tablet Take 1 tablet by mouth at bedtime.   Multiple Vitamins-Minerals (CENTRUM SILVER PO) Take 1 tablet by mouth daily. 0.4 mg-300 mcg-250 mcg   nitroGLYCERIN (NITROSTAT) 0.4 MG SL tablet Place 0.4 mg under the tongue every 5 (five) minutes  as needed for chest pain.   omeprazole (PRILOSEC) 40 MG capsule Take 40 mg by mouth 2 (two) times daily.   Potassium Chloride ER 20 MEQ TBCR Take 1 tablet by mouth daily.     Allergies:   Patient has no known allergies.   Social History   Socioeconomic History   Marital status: Married    Spouse name: Not on file   Number of children: Not on file   Years of education: Not on file   Highest education level: Not on file  Occupational History   Not on file  Tobacco Use  Smoking status: Former    Current packs/day: 0.00    Average packs/day: 3.0 packs/day for 40.0 years (120.0 ttl pk-yrs)    Types: Cigarettes    Start date: 47    Quit date: 33    Years since quitting: 35.2   Smokeless tobacco: Never  Vaping Use   Vaping status: Never Used  Substance and Sexual Activity   Alcohol use: Yes    Comment: Social    Drug use: Never   Sexual activity: Not on file  Other Topics Concern   Not on file  Social History Narrative   Not on file   Social Drivers of Health   Financial Resource Strain: Not on file  Food Insecurity: Not on file  Transportation Needs: Not on file  Physical Activity: Not on file  Stress: Not on file  Social Connections: Not on file     Family History: The patient's family history includes COPD in his brother; Heart attack in his mother; High blood pressure in his father and mother; Stroke in his father. ROS:   Please see the history of present illness.    All 14 point review of systems negative except as described per history of present illness.  EKGs/Labs/Other Studies Reviewed:    The following studies were reviewed today:   EKG:  EKG Interpretation Date/Time:  Wednesday June 28 2023 08:04:57 EDT Ventricular Rate:  68 PR Interval:    QRS Duration:  104 QT Interval:  420 QTC Calculation: 446 R Axis:   68  Text Interpretation: Atrial fibrillation Cannot rule out Anterior infarct (cited on or before 28-Jun-2023) Abnormal ECG When  compared with ECG of 02-May-2023 10:56, No significant change was found Confirmed by Huntley Dec reddy [phone]) on 06/28/2023 8:32:34 AM    Recent Labs: 05/02/2023: ALT 14; BUN 50; Creatinine, Ser 1.75; Hemoglobin 8.2; NT-Pro BNP 2,570; Platelets 228; Potassium 4.2; Sodium 140  Recent Lipid Panel No results found for: "CHOL", "TRIG", "HDL", "CHOLHDL", "VLDL", "LDLCALC", "LDLDIRECT"  Physical Exam:    VS:  BP 104/60   Pulse 68   Ht 6' (1.829 m)   Wt 216 lb (98 kg)   SpO2 98%   BMI 29.29 kg/m     Wt Readings from Last 3 Encounters:  06/28/23 216 lb (98 kg)  06/02/23 218 lb 9.6 oz (99.2 kg)  05/02/23 221 lb (100.2 kg)     GENERAL:  Well nourished, well developed in no acute distress NECK: No JVD; No carotid bruits CARDIAC: Irregularly irregular, S1 and S2 present, no murmurs, no rubs, no gallops CHEST:  Clear to auscultation without rales, wheezing or rhonchi  Extremities: 1+ bilateral pitting pedal edema. Pulses bilaterally symmetric with radial 2+ and dorsalis pedis 2+ NEUROLOGIC:  Alert and oriented x 3  Medication Adjustments/Labs and Tests Ordered: Current medicines are reviewed at length with the patient today.  Concerns regarding medicines are outlined above.  Orders Placed This Encounter  Procedures   Comprehensive metabolic panel   CBC   Magnesium   EKG 12-Lead   ECHOCARDIOGRAM COMPLETE   No orders of the defined types were placed in this encounter.   Signed, Cecille Amsterdam, MD, MPH, [facility]. 06/28/2023 8:40 AM    [facility]

## 2023-06-29 ENCOUNTER — Telehealth: Payer: Self-pay | Admitting: Emergency Medicine

## 2023-06-29 LAB — CBC
Hematocrit: 27.5 % — ABNORMAL LOW (ref 37.5–51.0)
Hemoglobin: 8.1 g/dL — ABNORMAL LOW (ref 13.0–17.7)
MCH: 30.1 pg (ref 26.6–33.0)
MCHC: 29.5 g/dL — ABNORMAL LOW (ref 31.5–35.7)
MCV: 102 fL — ABNORMAL HIGH (ref 79–97)
Platelets: 221 10*3/uL (ref 150–450)
RBC: 2.69 x10E6/uL — CL (ref 4.14–5.80)
RDW: 12.7 % (ref 11.6–15.4)
WBC: 6.4 10*3/uL (ref 3.4–10.8)

## 2023-06-29 LAB — COMPREHENSIVE METABOLIC PANEL
ALT: 11 IU/L (ref 0–44)
AST: 12 IU/L (ref 0–40)
Albumin: 4.1 g/dL (ref 3.7–4.7)
Alkaline Phosphatase: 55 IU/L (ref 44–121)
BUN/Creatinine Ratio: 19 (ref 10–24)
BUN: 34 mg/dL — ABNORMAL HIGH (ref 8–27)
Bilirubin Total: 0.5 mg/dL (ref 0.0–1.2)
CO2: 24 mmol/L (ref 20–29)
Calcium: 8.8 mg/dL (ref 8.6–10.2)
Chloride: 103 mmol/L (ref 96–106)
Creatinine, Ser: 1.75 mg/dL — ABNORMAL HIGH (ref 0.76–1.27)
Globulin, Total: 1.5 g/dL (ref 1.5–4.5)
Glucose: 154 mg/dL — ABNORMAL HIGH (ref 70–99)
Potassium: 4.4 mmol/L (ref 3.5–5.2)
Sodium: 142 mmol/L (ref 134–144)
Total Protein: 5.6 g/dL — ABNORMAL LOW (ref 6.0–8.5)
eGFR: 38 mL/min/{1.73_m2} — ABNORMAL LOW (ref >59)

## 2023-06-29 LAB — MAGNESIUM: Magnesium: 2.5 mg/dL — ABNORMAL HIGH (ref 1.6–2.3)

## 2023-06-29 NOTE — Telephone Encounter (Signed)
 Called and spoke to patient and reviewed results with patient as per Dr. Madireddy's note. Reviewed that Dr. Vincent Gros recommends following up closely with his PCP and gastroenterologist. Patient stated that he would contact his PCP's office.  Patient verbalized understanding and had no further questions.

## 2023-06-29 NOTE — Progress Notes (Addendum)
 Please inform him the blood work results once again show significant anemia with hemoglobin 8.1 and hematocrit 27.5.  These numbers are similar to his from a month ago.  Advised him to follow-up closely with his PCP and gastroentero allergist regarding further evaluation for anemia.  He was not having any obvious bleeding concerns as discussed yesterday at the office. Inform him to reach out to his PCPs office.  Thank you  Further discussed with his PCP Dr. Welton Flakes today [07-04-2023]. In the setting of elevated creatinine and his age reduce the dose of Eliquis to 2.5 mg twice daily. His hemoglobin at PCPs office further dropped to 7.9. He is going to be set up for further iron supplementation and any additional testing needed for identifying sources of bleed from gastrointestinal tract.

## 2023-06-29 NOTE — Telephone Encounter (Signed)
-----   Message from [place] R Madireddy sent at 06/29/2023 12:14 PM EDT ----- Please inform him the blood work results once again show significant anemia with hemoglobin 8.1 and hematocrit 27.5.  These numbers are similar to his from a month ago.  Advised him to follow-up closely with his PCP and gastroentero allergist regarding further evaluation for anemia.  He was not having any obvious bleeding concerns as discussed yesterday at the office. Inform him to reach out to his PCPs office.  Thank you

## 2023-06-30 DIAGNOSIS — D509 Iron deficiency anemia, unspecified: Secondary | ICD-10-CM | POA: Diagnosis not present

## 2023-07-04 DIAGNOSIS — H43821 Vitreomacular adhesion, right eye: Secondary | ICD-10-CM | POA: Diagnosis not present

## 2023-07-04 DIAGNOSIS — I10 Essential (primary) hypertension: Secondary | ICD-10-CM | POA: Diagnosis not present

## 2023-07-10 DIAGNOSIS — D509 Iron deficiency anemia, unspecified: Secondary | ICD-10-CM | POA: Diagnosis not present

## 2023-07-13 ENCOUNTER — Ambulatory Visit: Payer: Medicare Other

## 2023-07-13 DIAGNOSIS — H43821 Vitreomacular adhesion, right eye: Secondary | ICD-10-CM | POA: Diagnosis not present

## 2023-07-17 ENCOUNTER — Ambulatory Visit

## 2023-07-17 DIAGNOSIS — I5032 Chronic diastolic (congestive) heart failure: Secondary | ICD-10-CM

## 2023-07-17 DIAGNOSIS — D509 Iron deficiency anemia, unspecified: Secondary | ICD-10-CM | POA: Diagnosis not present

## 2023-07-17 LAB — ECHOCARDIOGRAM COMPLETE: S' Lateral: 3.6 cm

## 2023-07-27 DIAGNOSIS — K5790 Diverticulosis of intestine, part unspecified, without perforation or abscess without bleeding: Secondary | ICD-10-CM | POA: Diagnosis not present

## 2023-07-27 DIAGNOSIS — D649 Anemia, unspecified: Secondary | ICD-10-CM | POA: Diagnosis not present

## 2023-07-27 DIAGNOSIS — Z7901 Long term (current) use of anticoagulants: Secondary | ICD-10-CM | POA: Diagnosis not present

## 2023-07-27 DIAGNOSIS — K259 Gastric ulcer, unspecified as acute or chronic, without hemorrhage or perforation: Secondary | ICD-10-CM | POA: Diagnosis not present

## 2023-07-27 DIAGNOSIS — D509 Iron deficiency anemia, unspecified: Secondary | ICD-10-CM | POA: Diagnosis not present

## 2023-07-27 DIAGNOSIS — K649 Unspecified hemorrhoids: Secondary | ICD-10-CM | POA: Diagnosis not present

## 2023-07-27 DIAGNOSIS — K222 Esophageal obstruction: Secondary | ICD-10-CM | POA: Diagnosis not present

## 2023-08-04 ENCOUNTER — Other Ambulatory Visit: Payer: Self-pay

## 2023-08-04 MED ORDER — FUROSEMIDE 40 MG PO TABS: ORAL_TABLET | ORAL | 3 refills | Status: DC

## 2023-08-17 DIAGNOSIS — D509 Iron deficiency anemia, unspecified: Secondary | ICD-10-CM | POA: Diagnosis not present

## 2023-08-28 DIAGNOSIS — D509 Iron deficiency anemia, unspecified: Secondary | ICD-10-CM | POA: Diagnosis not present

## 2023-08-31 DIAGNOSIS — H43821 Vitreomacular adhesion, right eye: Secondary | ICD-10-CM | POA: Diagnosis not present

## 2023-09-01 ENCOUNTER — Ambulatory Visit: Payer: Medicare Other

## 2023-09-01 ENCOUNTER — Telehealth: Payer: Self-pay

## 2023-09-01 MED ORDER — FUROSEMIDE 40 MG PO TABS: ORAL_TABLET | ORAL | 3 refills | Status: AC

## 2023-09-01 NOTE — Telephone Encounter (Signed)
 Pt's medication was sent to pt's pharmacy as requested. Confirmation received.

## 2023-09-01 NOTE — Telephone Encounter (Signed)
 Patient would like his lasix  sent [facility]  Best number for pt [phone]

## 2023-09-04 DIAGNOSIS — D509 Iron deficiency anemia, unspecified: Secondary | ICD-10-CM | POA: Diagnosis not present

## 2023-09-25 DIAGNOSIS — D509 Iron deficiency anemia, unspecified: Secondary | ICD-10-CM | POA: Diagnosis not present

## 2023-09-26 DIAGNOSIS — J441 Chronic obstructive pulmonary disease with (acute) exacerbation: Secondary | ICD-10-CM | POA: Diagnosis not present

## 2023-09-26 DIAGNOSIS — J439 Emphysema, unspecified: Secondary | ICD-10-CM | POA: Diagnosis not present

## 2023-09-26 DIAGNOSIS — D509 Iron deficiency anemia, unspecified: Secondary | ICD-10-CM | POA: Diagnosis not present

## 2023-10-06 DIAGNOSIS — J449 Chronic obstructive pulmonary disease, unspecified: Secondary | ICD-10-CM | POA: Diagnosis not present

## 2023-10-06 DIAGNOSIS — R053 Chronic cough: Secondary | ICD-10-CM | POA: Diagnosis not present

## 2023-10-06 DIAGNOSIS — R918 Other nonspecific abnormal finding of lung field: Secondary | ICD-10-CM | POA: Diagnosis not present

## 2023-10-30 DIAGNOSIS — R7989 Other specified abnormal findings of blood chemistry: Secondary | ICD-10-CM | POA: Diagnosis not present

## 2023-10-30 DIAGNOSIS — K219 Gastro-esophageal reflux disease without esophagitis: Secondary | ICD-10-CM | POA: Diagnosis not present

## 2023-10-30 DIAGNOSIS — R634 Abnormal weight loss: Secondary | ICD-10-CM | POA: Diagnosis not present

## 2023-10-30 DIAGNOSIS — K222 Esophageal obstruction: Secondary | ICD-10-CM | POA: Diagnosis not present

## 2023-10-30 DIAGNOSIS — D509 Iron deficiency anemia, unspecified: Secondary | ICD-10-CM | POA: Diagnosis not present

## 2023-10-30 DIAGNOSIS — K5790 Diverticulosis of intestine, part unspecified, without perforation or abscess without bleeding: Secondary | ICD-10-CM | POA: Diagnosis not present

## 2023-10-30 DIAGNOSIS — R053 Chronic cough: Secondary | ICD-10-CM | POA: Diagnosis not present

## 2023-10-30 DIAGNOSIS — D649 Anemia, unspecified: Secondary | ICD-10-CM | POA: Diagnosis not present

## 2023-11-08 DIAGNOSIS — J449 Chronic obstructive pulmonary disease, unspecified: Secondary | ICD-10-CM | POA: Diagnosis not present

## 2023-11-08 DIAGNOSIS — R053 Chronic cough: Secondary | ICD-10-CM | POA: Diagnosis not present

## 2023-11-08 DIAGNOSIS — R918 Other nonspecific abnormal finding of lung field: Secondary | ICD-10-CM | POA: Diagnosis not present

## 2023-11-10 DIAGNOSIS — K4090 Unilateral inguinal hernia, without obstruction or gangrene, not specified as recurrent: Secondary | ICD-10-CM | POA: Diagnosis not present

## 2023-11-10 DIAGNOSIS — D509 Iron deficiency anemia, unspecified: Secondary | ICD-10-CM | POA: Diagnosis not present

## 2023-12-06 DIAGNOSIS — R053 Chronic cough: Secondary | ICD-10-CM | POA: Diagnosis not present

## 2023-12-06 DIAGNOSIS — J449 Chronic obstructive pulmonary disease, unspecified: Secondary | ICD-10-CM | POA: Diagnosis not present

## 2023-12-06 DIAGNOSIS — R918 Other nonspecific abnormal finding of lung field: Secondary | ICD-10-CM | POA: Diagnosis not present

## 2023-12-07 ENCOUNTER — Telehealth: Payer: Self-pay

## 2023-12-07 DIAGNOSIS — K4090 Unilateral inguinal hernia, without obstruction or gangrene, not specified as recurrent: Secondary | ICD-10-CM

## 2023-12-07 HISTORY — DX: Unilateral inguinal hernia, without obstruction or gangrene, not specified as recurrent: K40.90

## 2023-12-07 NOTE — Telephone Encounter (Signed)
 Dr. Liborio, chart reviewed for preoperative evaluation for pending hernia repair. In review of your office visit with patient on 06/28/23, there is mention of reducing Eliquis  to 2.5 mg twice daily which would be appropriate dose given his age and creatinine. However, it appears that a new Rx was not sent and Eliquis  5 mg BID remains on his chart.  Could you please clarify preferred Eliquis  dose?   Thank you, Rosaline EMERSON Bane, NP-C 12/07/2023, 11:19 AM [address], [address] [place], [place] [address] Office [phone] Fax [phone]

## 2023-12-07 NOTE — Telephone Encounter (Signed)
   Pre-operative Risk Assessment    Patient Name: Ian Diaz  DOB: [birth year] MRN: [id number]   Date of last office visit: 07/17/23 Date of next office visit: 01/02/24   Request for Surgical Clearance    Procedure:  Inguinal hernia repair with mesh  Date of Surgery:  Clearance 12/22/23                                Surgeon:  Dr. Ozell Skene Surgeon's Group or Practice Name:  [facility] [place] Phone number:  [phone] Fax number:  [phone]   Type of Clearance Requested:   - Pharmacy:  Hold Apixaban  (Eliquis ) please advise   Type of Anesthesia:  General    Additional requests/questions:    Bonney Calvert Pouch   12/07/2023, 10:31 AM

## 2023-12-08 ENCOUNTER — Encounter: Payer: Self-pay | Admitting: Cardiology

## 2023-12-08 ENCOUNTER — Ambulatory Visit: Attending: Cardiology | Admitting: Cardiology

## 2023-12-08 VITALS — BP 90/56 | HR 64 | Ht 72.0 in | Wt 200.6 lb

## 2023-12-08 DIAGNOSIS — I480 Paroxysmal atrial fibrillation: Secondary | ICD-10-CM | POA: Diagnosis not present

## 2023-12-08 DIAGNOSIS — I10 Essential (primary) hypertension: Secondary | ICD-10-CM

## 2023-12-08 DIAGNOSIS — E782 Mixed hyperlipidemia: Secondary | ICD-10-CM | POA: Diagnosis not present

## 2023-12-08 DIAGNOSIS — I5032 Chronic diastolic (congestive) heart failure: Secondary | ICD-10-CM

## 2023-12-08 DIAGNOSIS — I2510 Atherosclerotic heart disease of native coronary artery without angina pectoris: Secondary | ICD-10-CM

## 2023-12-08 DIAGNOSIS — Z01810 Encounter for preprocedural cardiovascular examination: Secondary | ICD-10-CM

## 2023-12-08 DIAGNOSIS — D6859 Other primary thrombophilia: Secondary | ICD-10-CM | POA: Diagnosis not present

## 2023-12-08 NOTE — Progress Notes (Signed)
 Cardiology Office Note   Date:  12/08/2023  ID:  Ian Diaz, DOB 1935-11-10, MRN [id number] PCP: Fernand Tracey LABOR, MD  [facility] Providers Cardiologist:  Alean SAUNDERS Madireddy, MD     History of Present Illness Ian Diaz is a 88 y.o. male with a past medical history of HFpEF, CAD s/p PCI of RCA in 2020, hypertension, dyslipidemia, atrial fibrillation, prior tobacco abuse, prior alcohol abuse.  07/17/2023 echo EF 60-65%, mildly elevated PASP, LA mod dilated, RA severely dilated, mild MR 06/13/2022 MPI normal perfusion, no ischemia 07/05/2018 echo EF 65-70%, mild TR, trace MR 2020 left heart cath PCI RCA in the setting of a non-STEMI  Previously established with with [facility] following left heart cath requiring PCI with DES to his RCA.  In 2024 he underwent an MPI revealing normal perfusion.  He establish care with Dr. Liborio in November 2024, newly diagnosed with atrial fibrillation, he was started on Eliquis  and his aspirin was discontinued.  In January 2025 he developed anemia requiring transfusion. Most recently was evaluated by Dr. Liborio on 06/28/2023, his hemoglobin had dropped, creatinine increased and his Eliquis  was reduced to 2.5 mg twice daily.  He presents today for preoperative evaluation accompanied by his wife.  He has been doing well since he was last evaluated in our office, does not have any formal complaints from a cardiac perspective.  He stays very physically active around his home, he is constantly working outside during the day.  He does have to sit down and rest after approximately 30 minutes of exertion.  His blood pressure is on the low normal side today, which may be contributing to that as well.  He has some pedal edema but overall this is well-controlled.  He denies chest pain, palpitations, dyspnea, pnd, orthopnea, n, v, dizziness, syncope, weight gain, or early satiety.     ROS: Review of Systems  Constitutional:  Positive for  malaise/fatigue.  Cardiovascular:  Positive for leg swelling.     Studies Reviewed      Cardiac Studies & Procedures   ______________________________________________________________________________________________     ECHOCARDIOGRAM  ECHOCARDIOGRAM COMPLETE 07/17/2023  Narrative ECHOCARDIOGRAM REPORT    Patient Name:   Ian Diaz Date of Exam: 07/17/2023 Medical Rec #:  [id number]    Height:       72.0 in Accession #:    [id number]   Weight:       216.0 lb Date of Birth:  09/19/1935    BSA:          2.201 m Patient Age:    87 years     BP:           104/60 mmHg Patient Gender: M            HR:           76 bpm. Exam Location:  [facility]  Procedure: 2D Echo, Cardiac Doppler, Color Doppler and Strain Analysis (Both Spectral and Color Flow Doppler were utilized during procedure).  Indications:    Chronic diastolic congestive heart failure (HCC) [I50.32 (ICD-10-CM)]  History:        Patient has no prior history of Echocardiogram examinations. CHF, CAD and Previous Myocardial Infarction; Arrythmias:Atrial Fibrillation.  Sonographer:    Lynwood Silvas RDCS Referring Phys: [id number] ALEAN SAUNDERS MADIREDDY  IMPRESSIONS   1. Left ventricular ejection fraction, by estimation, is 60 to 65%. The left ventricle has normal function. The left ventricle has no regional wall motion abnormalities. Left ventricular diastolic parameters are indeterminate.  The average left ventricular global longitudinal strain is -14.9 %. The global longitudinal strain is abnormal. 2. Right ventricular systolic function is normal. The right ventricular size is normal. There is mildly elevated pulmonary artery systolic pressure. 3. Left atrial size was moderately dilated. 4. Right atrial size was severely dilated. 5. The mitral valve is normal in structure. Mild mitral valve regurgitation. No evidence of mitral stenosis. 6. Tricuspid valve regurgitation is mild to moderate. 7. The aortic valve is normal in  structure. Aortic valve regurgitation is trivial. Aortic valve sclerosis/calcification is present, without any evidence of aortic stenosis. 8. The inferior vena cava is normal in size with greater than 50% respiratory variability, suggesting right atrial pressure of 3 mmHg.  FINDINGS Left Ventricle: Left ventricular ejection fraction, by estimation, is 60 to 65%. The left ventricle has normal function. The left ventricle has no regional wall motion abnormalities. The average left ventricular global longitudinal strain is -14.9 %. Strain was performed and the global longitudinal strain is abnormal. The left ventricular internal cavity size was normal in size. There is borderline left ventricular hypertrophy. Left ventricular diastolic parameters are indeterminate.  Right Ventricle: The right ventricular size is normal. No increase in right ventricular wall thickness. Right ventricular systolic function is normal. There is mildly elevated pulmonary artery systolic pressure. The tricuspid regurgitant velocity is 2.84 m/s, and with an assumed right atrial pressure of 8 mmHg, the estimated right ventricular systolic pressure is 40.3 mmHg.  Left [facility]: Left atrial size was moderately dilated.  Right [facility]: Right atrial size was severely dilated.  Pericardium: There is no evidence of pericardial effusion.  Mitral Valve: The mitral valve is normal in structure. Mild mitral valve regurgitation. No evidence of mitral valve stenosis.  Tricuspid Valve: The tricuspid valve is normal in structure. Tricuspid valve regurgitation is mild to moderate. No evidence of tricuspid stenosis.  Aortic Valve: The aortic valve is normal in structure. Aortic valve regurgitation is trivial. Aortic valve sclerosis/calcification is present, without any evidence of aortic stenosis.  Pulmonic Valve: The pulmonic valve was normal in structure. Pulmonic valve regurgitation is not visualized. No evidence of pulmonic  stenosis.  Aorta: The aortic root is normal in size and structure.  Venous: The inferior vena cava is normal in size with greater than 50% respiratory variability, suggesting right atrial pressure of 3 mmHg.  IAS/Shunts: No atrial level shunt detected by color flow Doppler.   LEFT VENTRICLE PLAX 2D LVIDd:         4.40 cm   Diastology LVIDs:         3.60 cm   LV e' medial:    11.50 cm/s LV PW:         1.10 cm   LV E/e' medial:  11.4 LV IVS:        1.20 cm   LV e' lateral:   13.90 cm/s LVOT diam:     2.30 cm   LV E/e' lateral: 9.5 LV SV:         75 LV SV Index:   34        2D Longitudinal Strain LVOT Area:     4.15 cm  2D Strain GLS Avg:     -14.9 %   RIGHT VENTRICLE             IVC RV Basal diam:  3.80 cm     IVC diam: 2.10 cm RV S prime:     15.50 cm/s TAPSE (M-mode): 2.6 cm  LEFT ATRIUM  Index        RIGHT ATRIUM           Index LA diam:        4.90 cm  2.23 cm/m   RA Area:     35.00 cm LA Vol (A2C):   114.0 ml 51.79 ml/m  RA Volume:   133.00 ml 60.42 ml/m LA Vol (A4C):   93.4 ml  42.43 ml/m LA Biplane Vol: 106.0 ml 48.15 ml/m AORTIC VALVE LVOT Vmax:   84.70 cm/s LVOT Vmean:  54.333 cm/s LVOT VTI:    0.180 m  AORTA Ao Root diam: 3.50 cm Ao Asc diam:  3.90 cm Ao Desc diam: 2.40 cm  MV E velocity: 131.50 cm/s  TRICUSPID VALVE TR Peak grad:   32.3 mmHg TR Vmax:        284.00 cm/s  SHUNTS Systemic VTI:  0.18 m Systemic Diam: 2.30 cm  Lamar Fitch MD Electronically signed by Lamar Fitch MD Signature Date/Time: 07/17/2023/12:32:21 PM    Final          ______________________________________________________________________________________________      Risk Assessment/Calculations  CHA2DS2-VASc Score = 5   This indicates a 7.2% annual risk of stroke. The patient's score is based upon: CHF History: 1 HTN History: 1 Diabetes History: 0 Stroke History: 0 Vascular Disease History: 1 Age Score: 2 Gender Score: 0             Physical Exam VS:  BP (!) 90/56   Pulse 64   Ht 6' (1.829 m)   Wt 200 lb 9.6 oz (91 kg)   SpO2 96%   BMI 27.21 kg/m        Wt Readings from Last 3 Encounters:  12/08/23 200 lb 9.6 oz (91 kg)  06/28/23 216 lb (98 kg)  06/02/23 218 lb 9.6 oz (99.2 kg)    GEN: Well nourished, well developed in no acute distress NECK: No JVD; No carotid bruits CARDIAC: Irregularly irregular, no murmurs, rubs, gallops RESPIRATORY:  Clear to auscultation without rales, wheezing or rhonchi  ABDOMEN: Soft, non-tender, non-distended EXTREMITIES: +1 pedal edema to mid tibia; No deformity   ASSESSMENT AND PLAN CAD-s/p PCI with DES to RCA in 2020, currently on Eliquis  therefore he is not on aspirin. Stable with no anginal symptoms. No indication for ischemic evaluation.    Atrial fibrillation/hypercoagulable state-CHA2DS2-VASc score is 5, his rate is controlled, currently on Eliquis  2.5 mg twice daily--dosed reduction was based on age of 57 and creatinine of 1.75.  He denies hematochezia, hematuria, hemoptysis.  He does have blood work last week with his PCP and was advised everything looked okay.  Will request a copy of blood work from their office.  HFpEF-NYHA class I-II, he has some pedal edema but appears to be well compensated, continue candesartan-HCTZ, Coreg, Lasix .  Hypertension-blood pressure is actually on the low normal side today at 90/56, at his last office visit appeared to be around 104 systolic, this is likely contributing to his fatigue at times.  I would have him keep a blood pressure log at home for 1 week and then make adjustments to his medications accordingly as we do not want him to have any falls.  If his blood pressure remains low, plan on decreasing his Coreg as his heart rate is 64 today.  Dyslipidemia-appears to be monitored formally by his PCP, currently on Lipitor 80 mg daily, prefer his LDL to be less than 70.  Preoperative cardiovascular evaluation- According to the Revised  Cardiac Risk  Index (RCRI), his Perioperative Risk of Major Cardiac Event is (%): 6.6 His Functional Capacity in METs is: 6.61 according to the Duke Activity Status Index (DASI). Therefore, based on ACC/AHA guidelines, patient would be at acceptable risk for the planned procedure without further cardiovascular testing. I will route this recommendation to the requesting party via Epic fax function.  Regarding his Eliquis , per our office protocol he can hold this 2 days prior to his procedure and resume as soon as possible as determined by the surgeon.       Dispo: Blood pressure log for 1 week, request labs from PCP.  Follow-up in 6 months.  Signed, Delon JAYSON Hoover, NP

## 2023-12-08 NOTE — Telephone Encounter (Signed)
 Left message for pt to call the office to schedule tele preop appt.

## 2023-12-08 NOTE — Telephone Encounter (Signed)
 Patient with diagnosis of A Fib on Eliquis  for anticoagulation.    Procedure:  Inguinal hernia repair with mesh  Date of procedure: 12/22/23   CHA2DS2-VASc Score = 5  This indicates a 7.2% annual risk of stroke. The patient's score is based upon: CHF History: 1 HTN History: 1 Diabetes History: 0 Stroke History: 0 Vascular Disease History: 1 Age Score: 2 Gender Score: 0    CrCl 42 ml/min Platelet count 221K  Patient has not had an Afib/aflutter ablation within the last 3 months or DCCV within the last 30 days   Per office protocol, patient can hold Eliquis  for 2 days prior to procedure.    **This guidance is not considered finalized until pre-operative APP has relayed final recommendations.**

## 2023-12-08 NOTE — Telephone Encounter (Signed)
   Name: Ian Diaz  DOB: 1936/02/17  MRN: [id number]  Primary Cardiologist: None   Preoperative team, please contact this patient and set up a phone call appointment for further preoperative risk assessment. Please obtain consent and complete medication review. Thank you for your help.  I confirm that guidance regarding antiplatelet and oral anticoagulation therapy has been completed and, if necessary, noted below.  Per Pharmacy 12/08/2023 Per office protocol, patient can hold Eliquis  for 2 days prior to procedure.      I also confirmed the patient resides in the state of [place] . As per [facility] Medical Board telemedicine laws, the patient must reside in the state in which the provider is licensed.   Lamarr Satterfield, NP 12/08/2023, 9:49 AM [facility]

## 2023-12-08 NOTE — Telephone Encounter (Signed)
 Pt called back and preferred to be seen in the office per pt request. Pt has been scheduled today inn [place] 3:10 Delon Hoover, NP for preop clearance. I did not cancel the 12/2023 appt with Dr. Liborio as this can be discussed at appt today if still needed. Pt thanked me for the help.

## 2023-12-08 NOTE — Patient Instructions (Signed)
 Please keep a BP log for 2 weeks and send by MyChart or mail.                         Name and DOB__________________________ Dr. Edwyna [address] [place], [place] [address]  Blood Pressure Record Sheet To take your blood pressure, you will need a blood pressure machine. You can buy a blood pressure machine (blood pressure monitor) at your clinic, drug store, or online. When choosing one, consider: An automatic monitor that has an arm cuff. A cuff that wraps snugly around your upper arm. You should be able to fit only one finger between your arm and the cuff. A device that stores blood pressure reading results. Do not choose a monitor that measures your blood pressure from your wrist or finger. Follow your health care provider's instructions for how to take your blood pressure. To use this form: Get one reading in the morning (a.m.) 1-2 hours after you take any medicines. Get one reading in the evening (p.m.) before supper.   Blood pressure log Date: _______________________  a.m. _____________________(1st reading) HR___________            p.m. _____________________(2nd reading) HR__________  Date: _______________________  a.m. _____________________(1st reading) HR___________            p.m. _____________________(2nd reading) HR__________  Date: _______________________  a.m. _____________________(1st reading) HR___________            p.m. _____________________(2nd reading) HR__________  Date: _______________________  a.m. _____________________(1st reading) HR___________            p.m. _____________________(2nd reading) HR__________  Date: _______________________  a.m. _____________________(1st reading) HR___________            p.m. _____________________(2nd reading) HR__________  Date: _______________________  a.m. _____________________(1st reading) HR___________            p.m. _____________________(2nd reading) HR__________  Date: _______________________  a.m.  _____________________(1st reading) HR___________            p.m. _____________________(2nd reading) HR__________   This information is not intended to replace advice given to you by your health care provider. Make sure you discuss any questions you have with your health care provider. Document Revised: 07/24/2019 Document Reviewed: 07/24/2019 Elsevier Patient Education  2021 Elsevier Inc.   Medication Instructions:  Your physician recommends that you continue on your current medications as directed. Please refer to the Current Medication list given to you today.  *If you need a refill on your cardiac medications before your next appointment, please call your pharmacy*   Lab Work: None ordered If you have labs (blood work) drawn today and your tests are completely normal, you will receive your results only by: MyChart Message (if you have MyChart) OR A paper copy in the mail If you have any lab test that is abnormal or we need to change your treatment, we will call you to review the results.   Testing/Procedures: None ordered   Follow-Up: At [facility], you and your health needs are our priority.  As part of our continuing mission to provide you with exceptional heart care, we have created designated Provider Care Teams.  These Care Teams include your primary Cardiologist (physician) and Advanced Practice Providers (APPs -  Physician Assistants and Nurse Practitioners) who all work together to provide you with the care you need, when you need it.  We recommend signing up for the patient portal called MyChart.  Sign up information is provided on this  After Visit Summary.  MyChart is used to connect with patients for Virtual Visits (Telemedicine).  Patients are able to view lab/test results, encounter notes, upcoming appointments, etc.  Non-urgent messages can be sent to your provider as well.   To learn more about what you can do with MyChart, go to [email or web address].     Your next appointment:   6 month(s)  The format for your next appointment:   In Person  Provider:   Alean Kobus, MD    Other Instructions none  Important Information About Sugar

## 2023-12-15 ENCOUNTER — Telehealth: Payer: Self-pay | Admitting: Cardiology

## 2023-12-15 MED ORDER — CARVEDILOL 6.25 MG PO TABS: 6.2500 mg | ORAL_TABLET | Freq: Two times a day (BID) | ORAL | 3 refills | Status: AC

## 2023-12-15 NOTE — Addendum Note (Signed)
 Addended by: ONEITA BERLINER on: 12/15/2023 12:55 PM   Modules accepted: Orders

## 2023-12-15 NOTE — Telephone Encounter (Signed)
 BP log looks good. I do think we should decrease his Coreg  to 6.25 mg twice daily to see if it helps with his fatigue level.

## 2023-12-15 NOTE — Telephone Encounter (Signed)
 Recommendations reviewed with Devere per DPR as per Delon Sexton note. Devere verbalized understanding and had no additional questions.

## 2023-12-22 DIAGNOSIS — I252 Old myocardial infarction: Secondary | ICD-10-CM | POA: Diagnosis not present

## 2023-12-22 DIAGNOSIS — Z7901 Long term (current) use of anticoagulants: Secondary | ICD-10-CM | POA: Diagnosis not present

## 2023-12-22 DIAGNOSIS — Z79899 Other long term (current) drug therapy: Secondary | ICD-10-CM | POA: Diagnosis not present

## 2023-12-22 DIAGNOSIS — I509 Heart failure, unspecified: Secondary | ICD-10-CM | POA: Diagnosis not present

## 2023-12-22 DIAGNOSIS — I110 Hypertensive heart disease with heart failure: Secondary | ICD-10-CM | POA: Diagnosis not present

## 2023-12-22 DIAGNOSIS — I2510 Atherosclerotic heart disease of native coronary artery without angina pectoris: Secondary | ICD-10-CM | POA: Diagnosis not present

## 2023-12-22 DIAGNOSIS — K219 Gastro-esophageal reflux disease without esophagitis: Secondary | ICD-10-CM | POA: Diagnosis not present

## 2023-12-22 DIAGNOSIS — Z7982 Long term (current) use of aspirin: Secondary | ICD-10-CM | POA: Diagnosis not present

## 2023-12-22 DIAGNOSIS — J449 Chronic obstructive pulmonary disease, unspecified: Secondary | ICD-10-CM | POA: Diagnosis not present

## 2023-12-22 DIAGNOSIS — R1310 Dysphagia, unspecified: Secondary | ICD-10-CM | POA: Diagnosis not present

## 2023-12-22 DIAGNOSIS — J439 Emphysema, unspecified: Secondary | ICD-10-CM | POA: Diagnosis not present

## 2023-12-22 DIAGNOSIS — D649 Anemia, unspecified: Secondary | ICD-10-CM | POA: Diagnosis not present

## 2023-12-22 DIAGNOSIS — Z955 Presence of coronary angioplasty implant and graft: Secondary | ICD-10-CM | POA: Diagnosis not present

## 2023-12-22 DIAGNOSIS — Z87891 Personal history of nicotine dependence: Secondary | ICD-10-CM | POA: Diagnosis not present

## 2023-12-22 DIAGNOSIS — E785 Hyperlipidemia, unspecified: Secondary | ICD-10-CM | POA: Diagnosis not present

## 2023-12-22 DIAGNOSIS — G8918 Other acute postprocedural pain: Secondary | ICD-10-CM | POA: Diagnosis not present

## 2023-12-22 DIAGNOSIS — K4090 Unilateral inguinal hernia, without obstruction or gangrene, not specified as recurrent: Secondary | ICD-10-CM | POA: Diagnosis not present

## 2023-12-22 DIAGNOSIS — I10 Essential (primary) hypertension: Secondary | ICD-10-CM | POA: Diagnosis not present

## 2024-01-02 ENCOUNTER — Ambulatory Visit

## 2024-01-29 DIAGNOSIS — D509 Iron deficiency anemia, unspecified: Secondary | ICD-10-CM | POA: Diagnosis not present

## 2024-01-31 DIAGNOSIS — D509 Iron deficiency anemia, unspecified: Secondary | ICD-10-CM | POA: Diagnosis not present

## 2024-01-31 DIAGNOSIS — Z23 Encounter for immunization: Secondary | ICD-10-CM | POA: Diagnosis not present

## 2024-03-09 ENCOUNTER — Other Ambulatory Visit: Payer: Self-pay

## 2024-03-09 DIAGNOSIS — I4891 Unspecified atrial fibrillation: Secondary | ICD-10-CM

## 2024-03-11 MED ORDER — APIXABAN 2.5 MG PO TABS: 2.5000 mg | ORAL_TABLET | Freq: Two times a day (BID) | ORAL | 1 refills | Status: AC

## 2024-03-11 NOTE — Telephone Encounter (Signed)
 Pt last saw Delon Hoover, NP on 12/08/23, last labs 06/28/23 Creat 1.75, age 88, weight 91kg, based on specified criteria pt is on appropriate dosage of Eliquis  2.5mg  BID for afib.  Will refill rx. Per last OV note dosage reduced given Creat and age.

## 2024-06-07 ENCOUNTER — Ambulatory Visit
